# Patient Record
Sex: Female | Born: 1950
Health system: Southern US, Community
[De-identification: ages and names within clinical notes are randomized; demographics above are authoritative.]

## PROBLEM LIST (undated history)

## (undated) DIAGNOSIS — C4490 Unspecified malignant neoplasm of skin, unspecified: Secondary | ICD-10-CM

## (undated) DIAGNOSIS — M1990 Unspecified osteoarthritis, unspecified site: Secondary | ICD-10-CM

## (undated) DIAGNOSIS — I82409 Acute embolism and thrombosis of unspecified deep veins of unspecified lower extremity: Secondary | ICD-10-CM

## (undated) HISTORY — PX: SKIN BIOPSY: SHX1

## (undated) HISTORY — PX: CHOLECYSTECTOMY: SHX55

## (undated) HISTORY — PX: BACK SURGERY: SHX140

## (undated) HISTORY — PX: ABDOMINAL HYSTERECTOMY: SHX81

---

## 2000-11-25 ENCOUNTER — Other Ambulatory Visit: Admission: RE | Admit: 2000-11-25 | Discharge: 2000-11-25 | Payer: Self-pay | Admitting: Gynecology

## 2001-07-11 ENCOUNTER — Other Ambulatory Visit: Admission: RE | Admit: 2001-07-11 | Discharge: 2001-07-11 | Payer: Self-pay | Admitting: Gynecology

## 2001-09-18 ENCOUNTER — Ambulatory Visit (HOSPITAL_COMMUNITY): Admission: RE | Admit: 2001-09-18 | Discharge: 2001-09-18 | Payer: Self-pay | Admitting: Gastroenterology

## 2002-09-20 ENCOUNTER — Other Ambulatory Visit: Admission: RE | Admit: 2002-09-20 | Discharge: 2002-09-20 | Payer: Self-pay | Admitting: Gynecology

## 2003-10-30 ENCOUNTER — Other Ambulatory Visit: Admission: RE | Admit: 2003-10-30 | Discharge: 2003-10-30 | Payer: Self-pay | Admitting: Gynecology

## 2004-11-04 ENCOUNTER — Other Ambulatory Visit: Admission: RE | Admit: 2004-11-04 | Discharge: 2004-11-04 | Payer: Self-pay | Admitting: Gynecology

## 2005-01-29 ENCOUNTER — Ambulatory Visit (HOSPITAL_COMMUNITY): Admission: RE | Admit: 2005-01-29 | Discharge: 2005-01-30 | Payer: Self-pay | Admitting: Neurosurgery

## 2005-03-02 ENCOUNTER — Inpatient Hospital Stay (HOSPITAL_COMMUNITY): Admission: EM | Admit: 2005-03-02 | Discharge: 2005-03-06 | Payer: Self-pay | Admitting: Emergency Medicine

## 2005-08-19 ENCOUNTER — Encounter: Payer: Self-pay | Admitting: Vascular Surgery

## 2005-08-19 ENCOUNTER — Ambulatory Visit (HOSPITAL_COMMUNITY): Admission: RE | Admit: 2005-08-19 | Discharge: 2005-08-19 | Payer: Self-pay | Admitting: Family Medicine

## 2005-11-02 ENCOUNTER — Ambulatory Visit (HOSPITAL_COMMUNITY): Admission: RE | Admit: 2005-11-02 | Discharge: 2005-11-02 | Payer: Self-pay | Admitting: Family Medicine

## 2005-11-02 ENCOUNTER — Encounter: Payer: Self-pay | Admitting: Vascular Surgery

## 2006-05-11 IMAGING — CR DG CHEST 2V
2 series · 2 of 2 positions shown · non-contrast
Comparison: none

CLINICAL DATA: DVT right leg with back surgery a month ago.
 CHEST - 2 VIEW:

[view not recorded (1 of 2)]
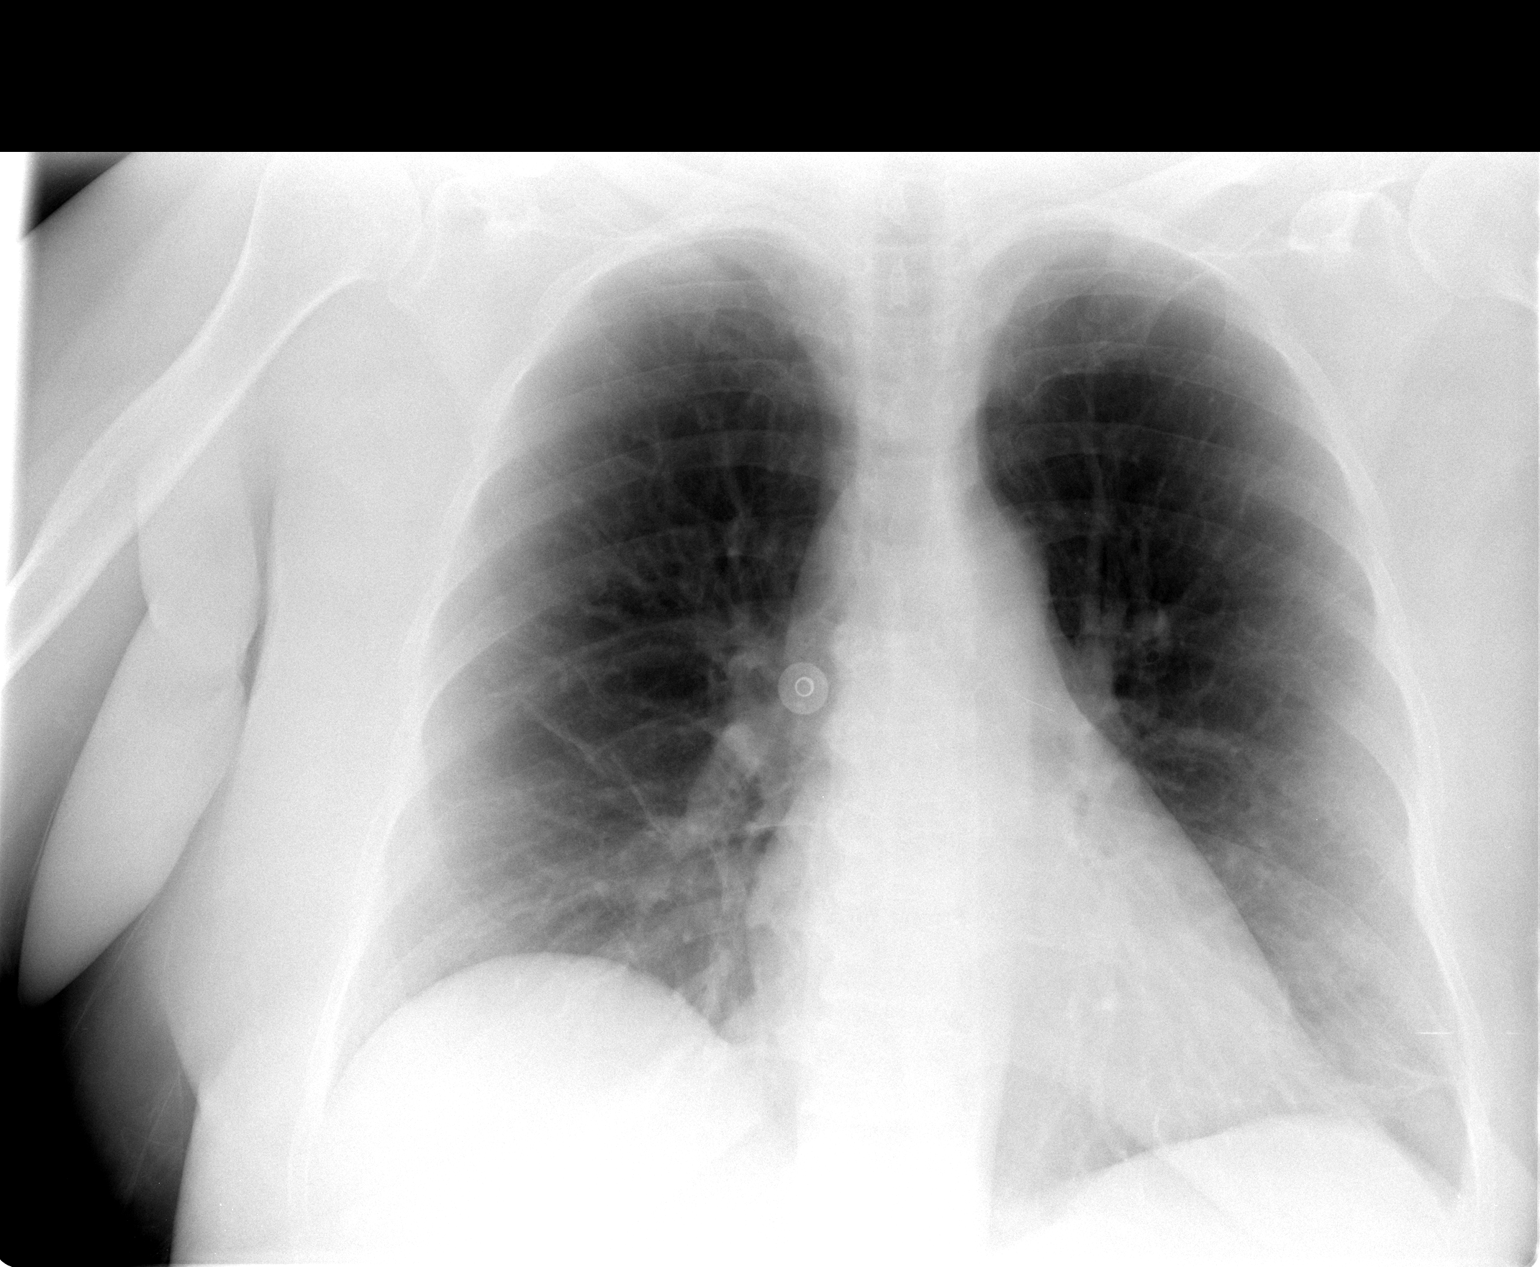

[view not recorded (2 of 2)]
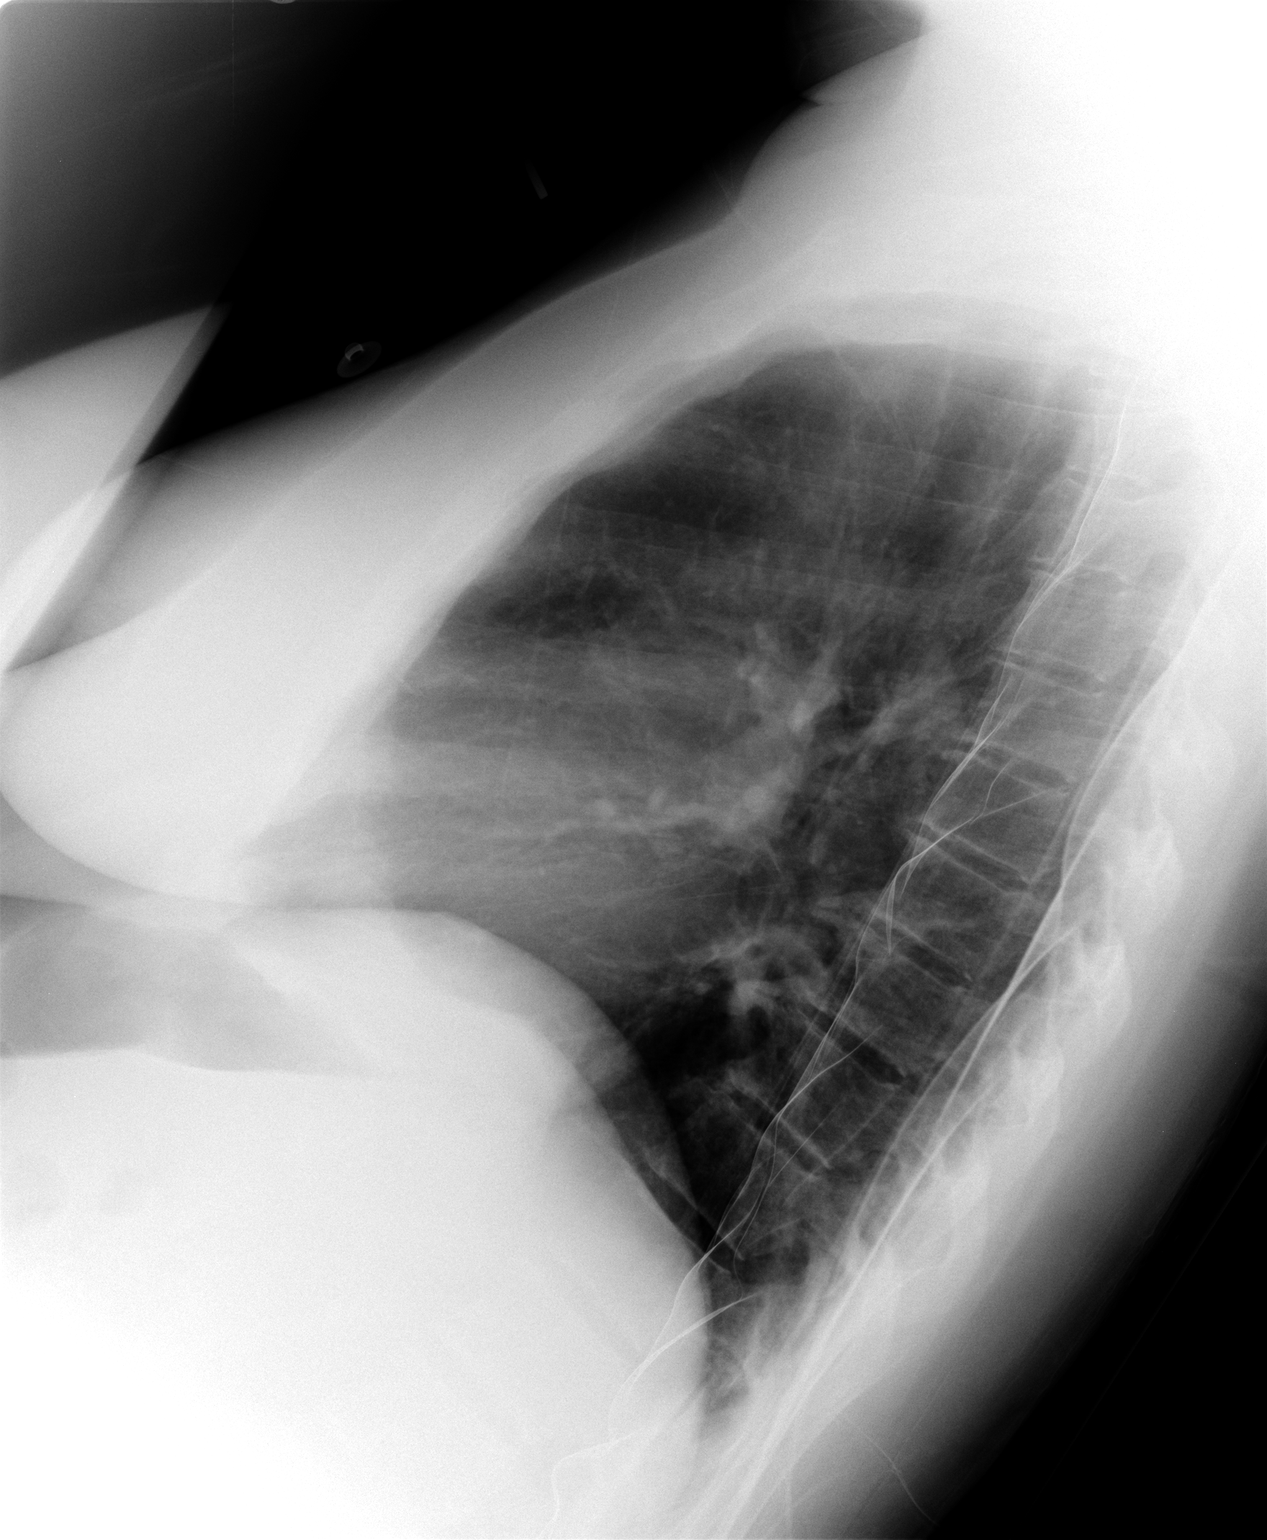

[2 of 2 positions shown; findings below may reference images not displayed]

FINDINGS: Two views of the chest show linear opacities at the left base and right mid lung base, most consistent with atelectasis or scarring.  No active process is seen.  The heart is mildly enlarged.  No bony abnormality is seen.
IMPRESSION: No active lung disease.  Linear scarring or atelectasis at the bases.

## 2008-01-04 ENCOUNTER — Emergency Department (HOSPITAL_COMMUNITY): Admission: EM | Admit: 2008-01-04 | Discharge: 2008-01-04 | Payer: Self-pay | Admitting: Emergency Medicine

## 2010-07-10 NOTE — Op Note (Signed)
   [name], [name]                     ACCOUNT NO.:  [id number]   MEDICAL RECORD NO.:  [id number]                   PATIENT TYPE:  AMB   LOCATION:  ENDO                                 FACILITY:  [facility]   PHYSICIAN:  Barrie Folk, M.D.                  DATE OF BIRTH:  [birth year]   DATE OF PROCEDURE:  09/18/2001  DATE OF DISCHARGE:                                 OPERATIVE REPORT   PROCEDURE:  Colonoscopy.   INDICATIONS FOR PROCEDURE:  Screening colonoscopy in a 59 year old patient  with no prior colon screening.   DESCRIPTION OF PROCEDURE:  The patient was placed in the left lateral  decubitus position and placed on the pulse monitor with continuous low-flow  oxygen delivered by nasal cannula.  She was sedated with 70 mg IV Demerol  and 8 mg IV Versed.  The Olympus video colonoscope was inserted into the  rectum and advanced to the cecum, confirmed by transillumination at  McBurney's point and visualization of the ileocecal valve and appendiceal  orifice.  The prep was excellent.  The cecum, ascending, transverse,  descending, and sigmoid colon all appeared normal with no masses, polyps,  diverticula, or other mucosal abnormalities.  The rectum likewise appeared  normal, and retroflexed view of the anus revealed no obvious internal  hemorrhoids.  The colonoscope was then withdrawn, and the patient returned  to the recovery room in stable condition.  She tolerated the procedure well,  and there were no immediate complications.   IMPRESSION:  Normal colonoscopy.   PLAN:  Flexible sigmoidoscopy in five years.                                               Barrie Folk, M.D.    JCH/MEDQ  D:  09/18/2001  T:  09/21/2001  Job:  [id number]   cc:   Vanita Panda

## 2010-07-10 NOTE — Discharge Summary (Signed)
[name], [name]               ACCOUNT NO.:  [id number]   MEDICAL RECORD NO.:  [id number]          PATIENT TYPE:  INP   LOCATION:  4735                         FACILITY:  [facility]   PHYSICIAN:  Jackie Plum, M.D.DATE OF BIRTH:  17-Aug-1950   DATE OF ADMISSION:  03/02/2005  DATE OF DISCHARGE:  03/06/2005                                 DISCHARGE SUMMARY   DISCHARGE DIAGNOSES:  1.  Right lower extremity deep vein thrombosis.  2.  Infected surgical wound on antibiotics.  3.  History of recent back surgery.  4.  History of irritable bowel syndrome.   DISCHARGE MEDICATIONS:  1.  Coumadin 1 mg, two tablets q.h.s. until any changes effected by her      primary care physician, Dr. Idell Pickles, based on outpatient pro time level.  2.  Ambien 5 mg p.o. q.h.s. for sleep.  3.  Ciprofloxacin 500 mg p.o. b.i.d.   CONSULTATIONS:  1.  Wound care team.  2.  Kathaleen Maser. Pool, M.D. of neurosurgery.   DISCHARGE CONDITION:  Improved and satisfactory.   The patient presented with right leg pain and swelling.  She had recently  had a right L5-S1 laminectomy and microdiskectomy by Dr. Jordan Likes on January 29, 2005.  Imaging studies revealed DVT involving the right lower extremity  which was quite extensive, all the way to the femoral vein.  She has had an  infected wound at the site of her surgery.  She was therefore admitted for  further management.  On admission, the patient was placed on telemetry  monitoring.  There were no significant dysarrthmias.  She had complaints of  chest pain which was at times worrisome for possible extension of her DVT to  pulmonary emboli.  We therefore obtained a CT angiogram which was negative  for any PE fortunately.  The patient was seen by the wound care team as well  as Dr. Jordan Likes who recommended p.o. antibiotics with Cipro.  The wound cultures  were positive for streptococcus which was sensitive to fluoroquinolones.  Cipro was continued with improvement in her wound.   The patient has been  therapeutic on her Coumadin, based on INR checks and she has not had any  other pulmonary complaints.  She is doing fine, and she is deemed ready for  discharge today with followup.   The patient has been asked to call her PCP's office on Monday for an  appointment to see her PCP in 7-10 days, earlier if there are any problems.  I have also ask her to go to the PCP's office on Monday for a pro time check  at which time the dosage of her Coumadin may be adjusted based on the pro  time level by her PCP.  The patient is  discharged in stable and satisfactory condition.  Her cardiopulmonary exam  was unremarkable today.  Her vital signs remained stable.  And, we spent  some time to discuss all the workup done for her that was significant for  this workup and this is regarding her anticoagulation.  She is discharged in  stable and satisfactory condition.  Jackie Plum, M.D.  Electronically Signed     GO/MEDQ  D:  03/06/2005  T:  03/06/2005  Job:  [id number]   cc:   Dellis Anes. Idell Pickles, M.D.  Fax: [phone]   Kathaleen Maser. Pool, M.D.  Fax: [phone]

## 2010-07-10 NOTE — Op Note (Signed)
[name], [name]               ACCOUNT NO.:  [id number]   MEDICAL RECORD NO.:  [id number]          PATIENT TYPE:  AMB   LOCATION:  SDS                          FACILITY:  [facility]   PHYSICIAN:  Henry A. Pool, M.D.    DATE OF BIRTH:  1950-07-10   DATE OF PROCEDURE:  01/29/2005  DATE OF DISCHARGE:                                 OPERATIVE REPORT   PREOPERATIVE DIAGNOSIS:  Right L5-S1 herniated nucleus pulposus with  radiculopathy.   POSTOPERATIVE DIAGNOSIS:  Right L5-S1 herniated nucleus pulposus with  radiculopathy.   PROCEDURE NAME:  Right L5-S1 laminotomy and microdiskectomy.   SURGEON:  Kathaleen Maser. Pool, M.D.   ASSISTANT:  Tia Alert, M.D.   ANESTHESIA:  General orotracheal.   INDICATIONS:  Ms. Pelot is a 60 year old female with history of severe  back and right lower extremity pain consistent with right-sided S1  radiculopathy.  She has failed conservative management and workup  demonstrates evidence of a rightward L5-S1 disk herniation with marked  compression of the right-sided S1 nerve root.  The patient has failed  conservative management and decided to proceed with a right-sided L5-S1  laminotomy and microdiskectomy in hopes of improving her symptoms.   OPERATIVE NOTE:  The patient was taken to the operating room and placed on  the operating table in a supine position.  After adequate levels of  anesthesia achieved, the patient turned prone onto a Wilson frame and  appropriately padded.  The patient's lumbar region was prepped and draped  sterilely.  A 10 blade was used to make a linear skin incision overlying the  L5-S1 interspace.  This was carried down sharply in the midline.  Subperiosteal dissection was performed on the right side, exposing the  laminae and facet joints at L5 and S1.  A deep self-retaining retractor was  placed.  Intraoperative x-ray was taken and the level was confirmed.  A  laminotomy was then performed using a high-speed drill and  Kerrison rongeurs  to remove the inferior aspect of the lamina of L5, the medial aspect of the  L5-S1 facet joint and the superior rim of the S1 lamina.  The ligamentum  flavum was elevated and resected in a piecemeal fashion using Kerrison  rongeurs.  The underlying thecal sac and exiting S1 nerve root were  identified.  The microscope was brought into the field for microdissection  of the right-sided nerve root underlying disk herniation.  The epidural  venous plexus was coagulated and cut.  The thecal sac and S1 nerve root were  gradually mobilized and retracted toward the midline.  The disk herniation  was readily apparent.  A number of free fragments were encountered and  completely resected.  The disk space was incised with a 15 blade in a  rectangular fashion.  A wide disk space clean-out was then achieved using  pituitary rongeurs, upward-angled pituitary rongeurs and Epstein curettes.  All elements of the disk herniation were completely resected.  All loose or  obviously degenerative disk material was removed from the interspace.  At  this point a very thorough decompression had  been achieved.  There was no  evidence of injury to the thecal sac or nerve roots.  The wound was then  irrigated with antibiotic solution.  Gelfoam was placed topically for  hemostasis, found to be good.  The  microscope and retractor system were removed.  Hemostasis in the muscle was  achieved with electrocautery and the wounds were closed in layers with  Vicryl suture.  Steri-Strips and a sterile dressing were applied.  There  were no complications.  The patient tolerated the procedure well and she  returns to the recovery room postop.           ______________________________  Kathaleen Maser. Pool, M.D.     HAP/MEDQ  D:  01/29/2005  T:  01/30/2005  Job:  [id number]

## 2010-07-10 NOTE — H&P (Signed)
[name], [name]               ACCOUNT NO.:  [id number]   MEDICAL RECORD NO.:  [id number]          PATIENT TYPE:  INP   LOCATION:  4735                         FACILITY:  [facility]   PHYSICIAN:  Theone Stanley, MD   DATE OF BIRTH:  January 25, 1951   DATE OF ADMISSION:  03/02/2005  DATE OF DISCHARGE:                                HISTORY & PHYSICAL   CHIEF COMPLAINT:  Right leg swelling.   HISTORY OF PRESENT ILLNESS:  [name] is a very pleasant 60 year old  Caucasian female who recently had back surgery, a right L5-S1 laminectomy  and microdiskectomy by Dr. Jordan Likes on January 29, 2005.  The patient has been  doing reasonably well, only recent issue for the past week is she has had  increased drainage from her wound site.  On Saturday, she started to have  increasing swelling and a feeling of tightness in her right leg.  Her and  her husband elevated the leg, placed ice, however, it did not resolve.  Because of this increasing tightness and pain, she presented to the ER.  In  the ER an ultrasound was performed which showed a DVT which was extensive  all the way to the femoral vein.   PAST MEDICAL HISTORY:  1.  Back pain with recent surgery.  2.  IBS.   MEDICATIONS:  Currently none.   ALLERGIES:  1.  The patient is quite sensitive to all OPIATES.  She gets severe      constipation, nausea and vomiting.  2.  She is allergic to PENICILLIN.  She does not know the reaction.  She had      some type of reaction as a child.  3.  TRAMADOL.  She gets a rash.   FAMILY HISTORY:  Father had pulmonary fibrosis.  Mother had temporal  arteritis.  She had an aunt with brain cancer and also have a history of  diabetes and peptic ulcer disease in the family.   SOCIAL HISTORY:  The patient lives in [place] [facility], is married, and has two  grown children.  No tobacco, alcohol, or illicit drug use.   SURGERY:  1.  She had the recent L5-S1 laminectomy and microdiskectomy.  2.  Hysterectomy.  3.   Bladder tack.  4.  Rectal tack.   REVIEW OF SYSTEMS:  Mainly constipation.  All other systems were negative.   PHYSICAL EXAMINATION:  VITAL SIGNS:  Temperature of 97.1, blood pressure of  118/84, respiratory rate of 16, pulse of 119, sating 96% on room air.  HEENT:  Head atraumatic, normocephalic.  Eyes 2-mm, pupils reactive to  light.  Extraocular movements intact.  Ears and nose  no discharge.  Throat  clear.  No erythema.  No exudate.  Mucosa moist.  NECK:  Supple.  No lymphadenopathy.  No JVD.  HEART:  Regular rate and rhythm.  No murmurs, rubs, or gallops heard.  LUNGS:  Clear to auscultation bilaterally.  ABDOMEN:  Soft, nontender, nondistended.  EXTREMITIES:  The patient had a quite evident swelling on the right compared  to the left, some mild edema on the  right.  No cyanosis or clubbing.  NEUROLOGIC:  The patient was alert and oriented x3.  Nonfocal.  GU:  Deferred.   LABORATORY/RADIOLOGY:  White count of 11, hemoglobin 12, hematocrit 36,  platelets at 139.  PT of 14, INR of 1.1, PTT of 44.  Sodium 138, potassium  3.5, chloride at 104, CO2 of 23, glucose at 105, BUN at 2, creatinine of  0.6, calcium at 9.2.  Total protein at 6.2.  Albumin at 2.9.  AST at 16, ALT  at 19, alk phos of 20.  Total bili at 0.7.  Chest x-ray pending.  EKG  pending.  Doppler ultrasound of the right leg shows extensive clot in the  deep veins all the way up to the femoral vein.   ASSESSMENT/PLAN:  1.  Right sided deep vein thrombosis.  The patient was placed on heparin.      We will continue this, start Coumadin, and continue to observe the      patient on telemetry.  2.  Wound drainage.  I was able to contact Dr. Lindalou Hose partner, Dr. Wynetta Emery and      discussed the situation with him.  An MRI of the lumbar spine with and      without contrast will be obtained.  Blood cultures and wound culture      will also be obtained.  ESR and CRP levels will also be obtained.  Once      these are done, we will  start Unasyn empirically.  One of Dr. Lindalou Hose      partners or Dr. Jordan Likes will come by tomorrow to assess the patient at that      point.  3.  Prophylaxis.  The patient is on heparin.  We will start a proton pump      inhibitor.      Theone Stanley, MD  Electronically Signed     AEJ/MEDQ  D:  03/02/2005  T:  03/02/2005  Job:  [id number]   cc:   Dellis Anes. Idell Pickles, M.D.  Fax: [phone]   Kathaleen Maser. Pool, M.D.  Fax: [phone]

## 2011-06-12 ENCOUNTER — Emergency Department (HOSPITAL_COMMUNITY)
Admission: EM | Admit: 2011-06-12 | Discharge: 2011-06-12 | Disposition: A | Discharge status: Home / Self Care | Payer: BC Managed Care – PPO | Attending: Emergency Medicine | Admitting: Emergency Medicine

## 2011-06-12 ENCOUNTER — Encounter (HOSPITAL_COMMUNITY): Payer: Self-pay | Admitting: *Deleted

## 2011-06-12 DIAGNOSIS — M79609 Pain in unspecified limb: Secondary | ICD-10-CM | POA: Insufficient documentation

## 2011-06-12 DIAGNOSIS — Z85820 Personal history of malignant melanoma of skin: Secondary | ICD-10-CM | POA: Insufficient documentation

## 2011-06-12 DIAGNOSIS — M549 Dorsalgia, unspecified: Secondary | ICD-10-CM

## 2011-06-12 DIAGNOSIS — Z86718 Personal history of other venous thrombosis and embolism: Secondary | ICD-10-CM | POA: Insufficient documentation

## 2011-06-12 DIAGNOSIS — M545 Low back pain, unspecified: Secondary | ICD-10-CM | POA: Insufficient documentation

## 2011-06-12 DIAGNOSIS — M129 Arthropathy, unspecified: Secondary | ICD-10-CM | POA: Insufficient documentation

## 2011-06-12 DIAGNOSIS — Z79899 Other long term (current) drug therapy: Secondary | ICD-10-CM | POA: Insufficient documentation

## 2011-06-12 HISTORY — DX: Acute embolism and thrombosis of unspecified deep veins of unspecified lower extremity: I82.409

## 2011-06-12 HISTORY — DX: Unspecified malignant neoplasm of skin, unspecified: C44.90

## 2011-06-12 HISTORY — DX: Unspecified osteoarthritis, unspecified site: M19.90

## 2011-06-12 MED ORDER — METHYLPREDNISOLONE 4 MG PO KIT: PACK | ORAL | Status: AC
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
## 2011-06-12 NOTE — Discharge Instructions (Signed)
Back Pain, Adult Low back pain is very common. About 1 in 5 people have back pain.The cause of low back pain is rarely dangerous. The pain often gets better over time.About half of people with a sudden onset of back pain feel better in just 2 weeks. About 8 in 10 people feel better by 6 weeks.  CAUSES Some common causes of back pain include:  Strain of the muscles or ligaments supporting the spine.   Wear and tear (degeneration) of the spinal discs.   Arthritis.   Direct injury to the back.  DIAGNOSIS Most of the time, the direct cause of low back pain is not known.However, back pain can be treated effectively even when the exact cause of the pain is unknown.Answering your caregiver's questions about your overall health and symptoms is one of the most accurate ways to make sure the cause of your pain is not dangerous. If your caregiver needs more information, he or she may order lab work or imaging tests (X-rays or MRIs).However, even if imaging tests show changes in your back, this usually does not require surgery. HOME CARE INSTRUCTIONS For many people, back pain returns.Since low back pain is rarely dangerous, it is often a condition that people can learn to manageon their own.   Remain active. It is stressful on the back to sit or stand in one place. Do not sit, drive, or stand in one place for more than 30 minutes at a time. Take short walks on level surfaces as soon as pain allows.Try to increase the length of time you walk each day.   Do not stay in bed.Resting more than 1 or 2 days can delay your recovery.   Do not avoid exercise or work.Your body is made to move.It is not dangerous to be active, even though your back may hurt.Your back will likely heal faster if you return to being active before your pain is gone.   Pay attention to your body when you bend and lift. Many people have less discomfortwhen lifting if they bend their knees, keep the load close to their  bodies,and avoid twisting. Often, the most comfortable positions are those that put less stress on your recovering back.   Find a comfortable position to sleep. Use a firm mattress and lie on your side with your knees slightly bent. If you lie on your back, put a pillow under your knees.   Only take over-the-counter or prescription medicines as directed by your caregiver. Over-the-counter medicines to reduce pain and inflammation are often the most helpful.Your caregiver may prescribe muscle relaxant drugs.These medicines help dull your pain so you can more quickly return to your normal activities and healthy exercise.   Put ice on the injured area.   Put ice in a plastic bag.   Place a towel between your skin and the bag.   Leave the ice on for 15 to 20 minutes, 3 to 4 times a day for the first 2 to 3 days. After that, ice and heat may be alternated to reduce pain and spasms.   Ask your caregiver about trying back exercises and gentle massage. This may be of some benefit.   Avoid feeling anxious or stressed.Stress increases muscle tension and can worsen back pain.It is important to recognize when you are anxious or stressed and learn ways to manage it.Exercise is a great option.  SEEK MEDICAL CARE IF:  You have pain that is not relieved with rest or medicine.   You have   pain that does not improve in 1 week.   You have new symptoms.   You are generally not feeling well.  SEEK IMMEDIATE MEDICAL CARE IF:   You have pain that radiates from your back into your legs.   You develop new bowel or bladder control problems.   You have unusual weakness or numbness in your arms or legs.   You develop nausea or vomiting.   You develop abdominal pain.   You feel faint.  Document Released: 02/08/2005 Document Revised: 01/28/2011 Document Reviewed: 06/29/2010 [employer] Patient Information 2012 [employer]. 

## 2012-01-06 ENCOUNTER — Emergency Department (HOSPITAL_COMMUNITY)
Admission: EM | Admit: 2012-01-06 | Discharge: 2012-01-07 | Disposition: A | Discharge status: Home / Self Care | Payer: BC Managed Care – PPO | Attending: Emergency Medicine | Admitting: Emergency Medicine

## 2012-01-06 ENCOUNTER — Encounter (HOSPITAL_COMMUNITY): Payer: Self-pay | Admitting: Emergency Medicine

## 2012-01-06 DIAGNOSIS — Z79899 Other long term (current) drug therapy: Secondary | ICD-10-CM | POA: Insufficient documentation

## 2012-01-06 DIAGNOSIS — Z8739 Personal history of other diseases of the musculoskeletal system and connective tissue: Secondary | ICD-10-CM | POA: Insufficient documentation

## 2012-01-06 DIAGNOSIS — Z85828 Personal history of other malignant neoplasm of skin: Secondary | ICD-10-CM | POA: Insufficient documentation

## 2012-01-06 DIAGNOSIS — Z86718 Personal history of other venous thrombosis and embolism: Secondary | ICD-10-CM | POA: Insufficient documentation

## 2012-01-06 DIAGNOSIS — R319 Hematuria, unspecified: Secondary | ICD-10-CM

## 2012-01-06 DIAGNOSIS — N390 Urinary tract infection, site not specified: Secondary | ICD-10-CM | POA: Insufficient documentation

## 2012-01-06 LAB — URINALYSIS, ROUTINE W REFLEX MICROSCOPIC
Bilirubin Urine: NEGATIVE
Nitrite: NEGATIVE
Specific Gravity, Urine: 1.011 (ref 1.005–1.030)
pH: 5.5 (ref 5.0–8.0)

## 2012-01-06 LAB — URINE MICROSCOPIC-ADD ON

## 2012-01-06 NOTE — ED Notes (Signed)
Per EMS pt states she has blood in her urine and is c/o lower abd pain  Pt states sxs started a couple days ago  Pt states she also has a burning sensation with urination  Pain started around 9pm tonight

## 2012-01-06 NOTE — ED Notes (Signed)
WUJ:[id number]<JY> Expected date:01/06/12<BR> Expected time:10:17 PM<BR> Means of arrival:Ambulance<BR> Comments:<BR> abd pain

## 2012-01-07 ENCOUNTER — Emergency Department (HOSPITAL_COMMUNITY): Payer: BC Managed Care – PPO

## 2012-01-07 MED ORDER — CIPROFLOXACIN HCL 500 MG PO TABS: 500.0000 mg | ORAL_TABLET | Freq: Two times a day (BID) | ORAL | Status: DC

## 2012-01-07 MED ORDER — CIPROFLOXACIN HCL 500 MG PO TABS
500.0000 mg | ORAL_TABLET | Freq: Once | ORAL | Status: AC
  Administered 2012-01-07: 500 mg via ORAL
  Filled 2012-01-07: qty 1

## 2012-01-07 NOTE — ED Provider Notes (Signed)
History     CSN: [id number]  Arrival date & time 01/06/12  2229   First MD Initiated Contact with Patient 01/06/12 2318      Chief Complaint  Patient presents with  . Hematuria     HPI Pt was seen at 2320.  Per pt, c/o gradual onset and persistence of constant hematuria, dysuria, and suprapubic discomfort which began several days ago, worse today.  Pt has hx of chronic LBP and is unsure if it is any different from her usual chronic pain.  Denies vaginal bleeding/discharge, no N/V/D, no fevers, no flank pain, no injury.    Past Medical History  Diagnosis Date  . Arthritis     HX of chronic pain from Arthritis  . DVT, lower extremity   . Cancer of skin     scalp    Past Surgical History  Procedure Date  . Back surgery     disk surgery  . Cholecystectomy   . Abdominal hysterectomy   . Skin biopsy     Family History  Problem Relation Age of Onset  . COPD Mother   . Pulmonary fibrosis Father     History  Substance Use Topics  . Smoking status: Never Smoker   . Smokeless tobacco: Not on file  . Alcohol Use: No    Review of Systems ROS: Statement: All systems negative except as marked or noted in the HPI; Constitutional: Negative for fever and chills. ; ; Eyes: Negative for eye pain, redness and discharge. ; ; ENMT: Negative for ear pain, hoarseness, nasal congestion, sinus pressure and sore throat. ; ; Cardiovascular: Negative for chest pain, palpitations, diaphoresis, dyspnea and peripheral edema. ; ; Respiratory: Negative for cough, wheezing and stridor. ; ; Gastrointestinal: Negative for nausea, vomiting, diarrhea, abdominal pain, blood in stool, hematemesis, jaundice and rectal bleeding. . ; ; Genitourinary: +dysuria, hematuria. Negative for flank pain. ; ; GYN:  No vaginal bleeding, no vaginal discharge, no vulvar pain. ;; Musculoskeletal: Negative for back pain and neck pain. Negative for swelling and trauma.; ; Skin: Negative for pruritus, rash, abrasions,  blisters, bruising and skin lesion.; ; Neuro: Negative for headache, lightheadedness and neck stiffness. Negative for weakness, altered level of consciousness , altered mental status, extremity weakness, paresthesias, involuntary movement, seizure and syncope.       Allergies  Penicillins  Home Medications   Current Outpatient Rx  Name  Route  Sig  Dispense  Refill  . SUPER B COMPLEX PO   Oral   Take 1 tablet by mouth daily.         Marland Kitchen BIOTIN 5 MG PO CAPS   Oral   Take 1 capsule by mouth daily.         Marland Kitchen CALCIUM CARBONATE-VITAMIN D 600-200 MG-UNIT PO TABS   Oral   Take 1 tablet by mouth daily.         Marland Kitchen VITAMIN D3 1000 UNITS PO TABS   Oral   Take 1,000 Units by mouth 2 (two) times daily.         . COQ10 PO   Oral   Take 1 tablet by mouth daily.         Marland Kitchen CRANBERRY PO   Oral   Take 1 tablet by mouth 2 (two) times daily.         . ADULT MULTIVITAMIN W/MINERALS CH   Oral   Take 1 tablet by mouth daily.         Marland Kitchen NAPROXEN SODIUM 220  MG PO TABS   Oral   Take 440 mg by mouth 2 (two) times daily.         Marland Kitchen OVER THE COUNTER MEDICATION   Oral   Take 1 tablet by mouth daily. Schiff Mega Red joint supplement         . VITAMIN B-6 100 MG PO TABS   Oral   Take 200 mg by mouth 2 (two) times daily.         Marland Kitchen VITAMIN C 500 MG PO TABS   Oral   Take 500 mg by mouth daily.         Marland Kitchen VITAMIN E 400 UNITS PO CAPS   Oral   Take 400 Units by mouth daily.           BP 124/97  Pulse 84  Temp 98.1 F (36.7 C) (Oral)  Resp 20  SpO2 100%  Physical Exam 2325: Physical examination:  Nursing notes reviewed; Vital signs and O2 SAT reviewed;  Constitutional: Well developed, Well nourished, Well hydrated, In no acute distress; Head:  Normocephalic, atraumatic; Eyes: EOMI, PERRL, No scleral icterus; ENMT: Mouth and pharynx normal, Mucous membranes moist; Neck: Supple, Full range of motion, No lymphadenopathy; Cardiovascular: Regular rate and rhythm, No  murmur, rub, or gallop; Respiratory: Breath sounds clear & equal bilaterally, No rales, rhonchi, wheezes.  Speaking full sentences with ease, Normal respiratory effort/excursion; Chest: Nontender, Movement normal; Abdomen: Soft, Nontender, Nondistended, Normal bowel sounds; Genitourinary: No CVA tenderness; Extremities: Pulses normal, No tenderness, No edema, No calf edema or asymmetry.; Neuro: AA&Ox3, Major CN grossly intact.  Speech clear. No gross focal motor or sensory deficits in extremities. Gait steady.; Skin: Color normal, Warm, Dry.   ED Course  Procedures    MDM  MDM Reviewed: nursing note and vitals Interpretation: labs and CT scan   Results for orders placed during the hospital encounter of 01/06/12  URINALYSIS, ROUTINE W REFLEX MICROSCOPIC      Component Value Range   Color, Urine YELLOW  YELLOW   APPearance CLOUDY (*) CLEAR   Specific Gravity, Urine 1.011  1.005 - 1.030   pH 5.5  5.0 - 8.0   Glucose, UA NEGATIVE  NEGATIVE mg/dL   Hgb urine dipstick LARGE (*) NEGATIVE   Bilirubin Urine NEGATIVE  NEGATIVE   Ketones, ur NEGATIVE  NEGATIVE mg/dL   Protein, ur 409 (*) NEGATIVE mg/dL   Urobilinogen, UA 0.2  0.0 - 1.0 mg/dL   Nitrite NEGATIVE  NEGATIVE   Leukocytes, UA LARGE (*) NEGATIVE  URINE MICROSCOPIC-ADD ON      Component Value Range   WBC, UA TOO NUMEROUS TO COUNT  <3 WBC/hpf   RBC / HPF TOO NUMEROUS TO COUNT  <3 RBC/hpf   Bacteria, UA RARE  RARE   Urine-Other MUCOUS PRESENT     Ct Abdomen Pelvis Wo Contrast 01/07/2012  *RADIOLOGY REPORT*  Clinical Data: Hematuria  CT ABDOMEN AND PELVIS WITHOUT CONTRAST  Technique:  Multidetector CT imaging of the abdomen and pelvis was performed following the standard protocol without intravenous contrast.  Comparison: None  Findings: Lung bases:  Lung bases are clear.  No pericardial or pleural effusion.  Heart size is normal.  Abdomen/pelvis:  No focal liver abnormality.  Prior cholecystectomy.  No significant biliary dilatation.   The pancreas appears within normal limits.  Normal appearance of the spleen.  Normal appearing bilateral adrenal glands.  The right kidney is normal.  The left kidney is normal. Mild cystocele.  The urinary bladder  is otherwise within the normal limits.  Previous hysterectomy.  No enlarged upper abdominal lymph nodes.  There is no pelvic or inguinal adenopathy. There is no free fluid or fluid collections identified within the abdomen or pelvis.  The stomach appears normal.  The small bowel loops have a normal caliber without evidence for obstruction.  Normal appearance of the colon.  The appendix is not visualize separate from the right lower quadrant bowel loops.  No secondary signs of acute appendicitis.  Bones/Musculoskeletal:  No acute bony abnormalities identified.  No aggressive lytic or sclerotic bone lesions noted.  IMPRESSION:  1.  No acute findings identified.   Original Report Authenticated By: Signa Kell, M.D.       [phone]:   +UTI, UC pending.  No CT evidence of acute process, or urinary calculi.  Pt wants to go home now. Dx and testing d/w pt.  Questions answered.  Verb understanding, agreeable to d/c home with outpt f/u.        Laray Anger, DO 01/09/12 [phone]

## 2012-01-07 NOTE — ED Notes (Signed)
Pt observed ambulating in hallway to restroom with no problem.

## 2012-01-10 LAB — URINE CULTURE

## 2012-12-28 ENCOUNTER — Other Ambulatory Visit: Payer: Self-pay

## 2013-10-25 ENCOUNTER — Other Ambulatory Visit: Payer: Self-pay | Admitting: Family Medicine

## 2013-10-25 ENCOUNTER — Ambulatory Visit
Admission: RE | Admit: 2013-10-25 | Discharge: 2013-10-25 | Disposition: A | Discharge status: Home / Self Care | Payer: BC Managed Care – PPO | Source: Ambulatory Visit | Attending: Family Medicine | Admitting: Family Medicine

## 2013-10-25 DIAGNOSIS — J329 Chronic sinusitis, unspecified: Secondary | ICD-10-CM

## 2013-11-05 ENCOUNTER — Other Ambulatory Visit: Payer: Self-pay | Admitting: Family Medicine

## 2013-11-05 DIAGNOSIS — R51 Headache: Secondary | ICD-10-CM

## 2013-11-21 ENCOUNTER — Other Ambulatory Visit: Payer: BC Managed Care – PPO

## 2013-11-22 ENCOUNTER — Ambulatory Visit
Admission: RE | Admit: 2013-11-22 | Discharge: 2013-11-22 | Disposition: A | Discharge status: Home / Self Care | Payer: BC Managed Care – PPO | Source: Ambulatory Visit | Attending: Family Medicine | Admitting: Family Medicine

## 2013-11-22 DIAGNOSIS — R51 Headache: Secondary | ICD-10-CM

## 2013-11-22 MED ORDER — IOHEXOL 300 MG/ML  SOLN
75.0000 mL | Freq: Once | INTRAMUSCULAR | Status: AC | PRN
  Administered 2013-11-22: 75 mL via INTRAVENOUS

## 2014-08-19 ENCOUNTER — Other Ambulatory Visit: Payer: Self-pay

## 2015-05-21 DIAGNOSIS — H6123 Impacted cerumen, bilateral: Secondary | ICD-10-CM | POA: Diagnosis not present

## 2015-05-21 DIAGNOSIS — S80911A Unspecified superficial injury of right knee, initial encounter: Secondary | ICD-10-CM | POA: Diagnosis not present

## 2015-05-21 DIAGNOSIS — M79659 Pain in unspecified thigh: Secondary | ICD-10-CM | POA: Diagnosis not present

## 2015-06-09 DIAGNOSIS — E860 Dehydration: Secondary | ICD-10-CM | POA: Diagnosis not present

## 2015-06-09 DIAGNOSIS — E871 Hypo-osmolality and hyponatremia: Secondary | ICD-10-CM | POA: Diagnosis not present

## 2015-06-09 DIAGNOSIS — Z136 Encounter for screening for cardiovascular disorders: Secondary | ICD-10-CM | POA: Diagnosis not present

## 2015-06-11 DIAGNOSIS — M19011 Primary osteoarthritis, right shoulder: Secondary | ICD-10-CM | POA: Diagnosis not present

## 2015-06-11 DIAGNOSIS — E871 Hypo-osmolality and hyponatremia: Secondary | ICD-10-CM | POA: Diagnosis not present

## 2015-06-11 DIAGNOSIS — M19012 Primary osteoarthritis, left shoulder: Secondary | ICD-10-CM | POA: Diagnosis not present

## 2015-06-11 DIAGNOSIS — M7542 Impingement syndrome of left shoulder: Secondary | ICD-10-CM | POA: Diagnosis not present

## 2015-06-11 DIAGNOSIS — M7541 Impingement syndrome of right shoulder: Secondary | ICD-10-CM | POA: Diagnosis not present

## 2015-07-23 DIAGNOSIS — J309 Allergic rhinitis, unspecified: Secondary | ICD-10-CM | POA: Diagnosis not present

## 2015-07-23 DIAGNOSIS — M1711 Unilateral primary osteoarthritis, right knee: Secondary | ICD-10-CM | POA: Diagnosis not present

## 2015-07-23 DIAGNOSIS — M7541 Impingement syndrome of right shoulder: Secondary | ICD-10-CM | POA: Diagnosis not present

## 2015-08-30 DIAGNOSIS — Z1231 Encounter for screening mammogram for malignant neoplasm of breast: Secondary | ICD-10-CM | POA: Diagnosis not present

## 2015-09-24 DIAGNOSIS — M549 Dorsalgia, unspecified: Secondary | ICD-10-CM | POA: Diagnosis not present

## 2015-09-24 DIAGNOSIS — M19012 Primary osteoarthritis, left shoulder: Secondary | ICD-10-CM | POA: Diagnosis not present

## 2015-09-24 DIAGNOSIS — M1711 Unilateral primary osteoarthritis, right knee: Secondary | ICD-10-CM | POA: Diagnosis not present

## 2015-09-24 DIAGNOSIS — M162 Bilateral osteoarthritis resulting from hip dysplasia: Secondary | ICD-10-CM | POA: Diagnosis not present

## 2015-10-13 DIAGNOSIS — M549 Dorsalgia, unspecified: Secondary | ICD-10-CM | POA: Diagnosis not present

## 2015-10-13 DIAGNOSIS — M160 Bilateral primary osteoarthritis of hip: Secondary | ICD-10-CM | POA: Diagnosis not present

## 2015-10-13 DIAGNOSIS — M170 Bilateral primary osteoarthritis of knee: Secondary | ICD-10-CM | POA: Diagnosis not present

## 2015-10-13 DIAGNOSIS — M8580 Other specified disorders of bone density and structure, unspecified site: Secondary | ICD-10-CM | POA: Diagnosis not present

## 2015-10-14 ENCOUNTER — Other Ambulatory Visit: Payer: Self-pay | Admitting: Family Medicine

## 2015-10-14 ENCOUNTER — Ambulatory Visit
Admission: RE | Admit: 2015-10-14 | Discharge: 2015-10-14 | Disposition: A | Discharge status: Home / Self Care | Payer: Self-pay | Source: Ambulatory Visit | Attending: Family Medicine | Admitting: Family Medicine

## 2015-10-14 DIAGNOSIS — M179 Osteoarthritis of knee, unspecified: Secondary | ICD-10-CM | POA: Diagnosis not present

## 2015-10-14 DIAGNOSIS — M170 Bilateral primary osteoarthritis of knee: Secondary | ICD-10-CM

## 2015-10-14 DIAGNOSIS — M25562 Pain in left knee: Secondary | ICD-10-CM | POA: Diagnosis not present

## 2015-10-21 DIAGNOSIS — L814 Other melanin hyperpigmentation: Secondary | ICD-10-CM | POA: Diagnosis not present

## 2015-10-21 DIAGNOSIS — D235 Other benign neoplasm of skin of trunk: Secondary | ICD-10-CM | POA: Diagnosis not present

## 2015-10-21 DIAGNOSIS — Z85828 Personal history of other malignant neoplasm of skin: Secondary | ICD-10-CM | POA: Diagnosis not present

## 2015-10-21 DIAGNOSIS — L821 Other seborrheic keratosis: Secondary | ICD-10-CM | POA: Diagnosis not present

## 2015-10-21 DIAGNOSIS — L700 Acne vulgaris: Secondary | ICD-10-CM | POA: Diagnosis not present

## 2015-10-21 DIAGNOSIS — D485 Neoplasm of uncertain behavior of skin: Secondary | ICD-10-CM | POA: Diagnosis not present

## 2015-10-21 DIAGNOSIS — D1801 Hemangioma of skin and subcutaneous tissue: Secondary | ICD-10-CM | POA: Diagnosis not present

## 2015-12-01 DIAGNOSIS — M1711 Unilateral primary osteoarthritis, right knee: Secondary | ICD-10-CM | POA: Diagnosis not present

## 2015-12-01 DIAGNOSIS — H6123 Impacted cerumen, bilateral: Secondary | ICD-10-CM | POA: Diagnosis not present

## 2015-12-01 DIAGNOSIS — M549 Dorsalgia, unspecified: Secondary | ICD-10-CM | POA: Diagnosis not present

## 2015-12-01 DIAGNOSIS — M19011 Primary osteoarthritis, right shoulder: Secondary | ICD-10-CM | POA: Diagnosis not present

## 2015-12-01 DIAGNOSIS — Z23 Encounter for immunization: Secondary | ICD-10-CM | POA: Diagnosis not present

## 2015-12-01 DIAGNOSIS — M19012 Primary osteoarthritis, left shoulder: Secondary | ICD-10-CM | POA: Diagnosis not present

## 2015-12-09 DIAGNOSIS — M1711 Unilateral primary osteoarthritis, right knee: Secondary | ICD-10-CM | POA: Diagnosis not present

## 2015-12-16 DIAGNOSIS — M1711 Unilateral primary osteoarthritis, right knee: Secondary | ICD-10-CM | POA: Diagnosis not present

## 2015-12-16 DIAGNOSIS — M170 Bilateral primary osteoarthritis of knee: Secondary | ICD-10-CM | POA: Diagnosis not present

## 2015-12-23 DIAGNOSIS — M1711 Unilateral primary osteoarthritis, right knee: Secondary | ICD-10-CM | POA: Diagnosis not present

## 2015-12-23 DIAGNOSIS — Z6826 Body mass index (BMI) 26.0-26.9, adult: Secondary | ICD-10-CM | POA: Diagnosis not present

## 2015-12-30 DIAGNOSIS — M1711 Unilateral primary osteoarthritis, right knee: Secondary | ICD-10-CM | POA: Diagnosis not present

## 2016-01-09 DIAGNOSIS — M19049 Primary osteoarthritis, unspecified hand: Secondary | ICD-10-CM | POA: Diagnosis not present

## 2016-01-09 DIAGNOSIS — M19011 Primary osteoarthritis, right shoulder: Secondary | ICD-10-CM | POA: Diagnosis not present

## 2016-01-09 DIAGNOSIS — M19012 Primary osteoarthritis, left shoulder: Secondary | ICD-10-CM | POA: Diagnosis not present

## 2016-01-09 DIAGNOSIS — M170 Bilateral primary osteoarthritis of knee: Secondary | ICD-10-CM | POA: Diagnosis not present

## 2016-01-13 DIAGNOSIS — H5203 Hypermetropia, bilateral: Secondary | ICD-10-CM | POA: Diagnosis not present

## 2016-01-13 DIAGNOSIS — H26033 Infantile and juvenile nuclear cataract, bilateral: Secondary | ICD-10-CM | POA: Diagnosis not present

## 2016-01-13 DIAGNOSIS — H52223 Regular astigmatism, bilateral: Secondary | ICD-10-CM | POA: Diagnosis not present

## 2016-01-20 DIAGNOSIS — M5136 Other intervertebral disc degeneration, lumbar region: Secondary | ICD-10-CM | POA: Diagnosis not present

## 2016-01-20 DIAGNOSIS — G603 Idiopathic progressive neuropathy: Secondary | ICD-10-CM | POA: Diagnosis not present

## 2016-01-20 DIAGNOSIS — M9906 Segmental and somatic dysfunction of lower extremity: Secondary | ICD-10-CM | POA: Diagnosis not present

## 2016-01-20 DIAGNOSIS — M9904 Segmental and somatic dysfunction of sacral region: Secondary | ICD-10-CM | POA: Diagnosis not present

## 2016-01-20 DIAGNOSIS — M25552 Pain in left hip: Secondary | ICD-10-CM | POA: Diagnosis not present

## 2016-01-20 DIAGNOSIS — M9905 Segmental and somatic dysfunction of pelvic region: Secondary | ICD-10-CM | POA: Diagnosis not present

## 2016-01-20 DIAGNOSIS — M9903 Segmental and somatic dysfunction of lumbar region: Secondary | ICD-10-CM | POA: Diagnosis not present

## 2016-01-20 DIAGNOSIS — M25561 Pain in right knee: Secondary | ICD-10-CM | POA: Diagnosis not present

## 2016-01-28 ENCOUNTER — Encounter (HOSPITAL_COMMUNITY): Payer: Self-pay

## 2016-01-28 ENCOUNTER — Emergency Department (HOSPITAL_COMMUNITY)
Admission: EM | Admit: 2016-01-28 | Discharge: 2016-01-28 | Disposition: A | Discharge status: Home / Self Care | Payer: PPO | Attending: Emergency Medicine | Admitting: Emergency Medicine

## 2016-01-28 ENCOUNTER — Emergency Department (HOSPITAL_COMMUNITY): Payer: PPO

## 2016-01-28 DIAGNOSIS — Z85828 Personal history of other malignant neoplasm of skin: Secondary | ICD-10-CM | POA: Diagnosis not present

## 2016-01-28 DIAGNOSIS — X509XXA Other and unspecified overexertion or strenuous movements or postures, initial encounter: Secondary | ICD-10-CM | POA: Diagnosis not present

## 2016-01-28 DIAGNOSIS — M545 Low back pain: Secondary | ICD-10-CM | POA: Diagnosis not present

## 2016-01-28 DIAGNOSIS — S3992XA Unspecified injury of lower back, initial encounter: Secondary | ICD-10-CM | POA: Diagnosis not present

## 2016-01-28 DIAGNOSIS — Z79899 Other long term (current) drug therapy: Secondary | ICD-10-CM | POA: Insufficient documentation

## 2016-01-28 DIAGNOSIS — Y929 Unspecified place or not applicable: Secondary | ICD-10-CM | POA: Diagnosis not present

## 2016-01-28 DIAGNOSIS — Y9301 Activity, walking, marching and hiking: Secondary | ICD-10-CM | POA: Insufficient documentation

## 2016-01-28 DIAGNOSIS — Y999 Unspecified external cause status: Secondary | ICD-10-CM | POA: Insufficient documentation

## 2016-01-28 DIAGNOSIS — S39012A Strain of muscle, fascia and tendon of lower back, initial encounter: Secondary | ICD-10-CM | POA: Diagnosis not present

## 2016-01-28 MED ORDER — DEXAMETHASONE SODIUM PHOSPHATE 10 MG/ML IJ SOLN
10.0000 mg | Freq: Once | INTRAMUSCULAR | Status: AC
  Administered 2016-01-28: 10 mg via INTRAMUSCULAR
  Filled 2016-01-28: qty 1

## 2016-01-28 MED ORDER — PREDNISONE 10 MG (21) PO TBPK: 10.0000 mg | ORAL_TABLET | Freq: Every day | ORAL | 0 refills | Status: DC

## 2016-01-28 MED ORDER — KETOROLAC TROMETHAMINE 30 MG/ML IJ SOLN
30.0000 mg | Freq: Once | INTRAMUSCULAR | Status: AC
  Administered 2016-01-28: 30 mg via INTRAMUSCULAR
  Filled 2016-01-28: qty 1

## 2016-01-28 NOTE — ED Triage Notes (Signed)
PT C/O RIGHT LOWER BACK PAIN SINCE Monday. PT STS SHE WAS WALKING DOWN THE HALL, AND SHE FELT A "POP" TO RIGHT LOWER BACK. SHE STS SHE KEPT WALKING, AND THE PAIN GOT WORSE. SHE STS SHE PUT ICE ON IT, AND THE NEXT MORNING IT WAS BETTER, BUT THE PAIN IS NOT COMPLETELY GONE. PT STS SHE HAS A HX OF SPINE SURGERY AND A DVT IN THE RIGHT LOWER LEG, AND IS NOT SURE IF SHE MAY HAVE ANOTHER CLOT, BECAUSE THE PAIN TRAVELS DOWN BOTH LEGS. DENIES SOB, BUT IS VERY ANXIOUS ABOUT THE SITUATION.

## 2016-01-28 NOTE — ED Provider Notes (Signed)
[facility] DEPT Provider Note   CSN: [id number] Arrival date & time: 01/28/16  1448     History   Chief Complaint Chief Complaint  Patient presents with  . Back Pain    LOWER    HPI [name] is a 65 y.o. female.  Pt presents to the ED today because of a pop she felt in her back.  She said that she put ice on it and it has improved.  Pt has a hx of degenerative disc disease in her back and DVT.  She was worried the pop was a dvt.  Pt's pain travels down both legs, but she is able to walk.  She has no bowel or bladder problems.      Past Medical History:  Diagnosis Date  . Arthritis    HX of chronic pain from Arthritis  . Cancer of skin    scalp  . DVT, lower extremity ([place])     There are no active problems to display for this patient.   Past Surgical History:  Procedure Laterality Date  . ABDOMINAL HYSTERECTOMY    . BACK SURGERY     disk surgery  . CHOLECYSTECTOMY    . SKIN BIOPSY      OB History    No data available       Home Medications    Prior to Admission medications   Medication Sig Start Date End Date Taking? Authorizing Provider  acetaminophen (EXTRA STRENGTH PAIN RELIEF) 500 MG tablet Take 500 mg by mouth 3 (three) times daily with meals.    Yes Historical Provider, MD  Ascorbic Acid (VITAMIN C PO) Take 1 tablet by mouth daily.   Yes Historical Provider, MD  B Complex-C (SUPER B COMPLEX PO) Take 1 tablet by mouth daily.   Yes Historical Provider, MD  Biotin (BIOTIN 5000) 5 MG CAPS Take 1 capsule by mouth 2 (two) times daily with breakfast and lunch.    Yes Historical Provider, MD  Calcium Carbonate-Vitamin D (CALCIUM + D) 600-200 MG-UNIT TABS Take 1 tablet by mouth 2 (two) times daily with breakfast and lunch.    Yes Historical Provider, MD  cholecalciferol (VITAMIN D) 1000 units tablet Take 1,000 Units by mouth 2 (two) times daily.   Yes Historical Provider, MD  Coenzyme Q10 (COQ10 PO) Take 1 tablet by mouth daily with breakfast.     Yes Historical Provider, MD  CRANBERRY PO Take 4,200 mg by mouth 2 (two) times daily.    Yes Historical Provider, MD  Cyanocobalamin (VITAMIN B-12) 2500 MCG SUBL Place 1 tablet under the tongue daily.   Yes Historical Provider, MD  diclofenac sodium (VOLTAREN) 1 % GEL Apply 2-4 g topically 4 (four) times daily as needed (for pain on shoulders and knees).   Yes Historical Provider, MD  ferrous sulfate 325 (65 FE) MG tablet Take 325 mg by mouth 2 (two) times daily with a meal.   Yes Historical Provider, MD  fexofenadine (ALLEGRA) 180 MG tablet Take 180 mg by mouth daily with breakfast.   Yes Historical Provider, MD  glucosamine-chondroitin 500-400 MG tablet Take 1 tablet by mouth 2 (two) times daily at 8 am and 10 pm.    Yes Historical Provider, MD  Lactobacillus (ACIDOPHILUS) CAPS capsule Take 1 capsule by mouth 3 (three) times daily.    Yes Historical Provider, MD  montelukast (SINGULAIR) 10 MG tablet Take 10 mg by mouth at bedtime.   Yes Historical Provider, MD  Nutritional Supplements (JUICE PLUS FIBRE PO)  Take 3 capsules by mouth 2 (two) times daily.   Yes Historical Provider, MD  Omega-3 Fatty Acids (FISH OIL) 1200 MG CAPS Take 1 capsule by mouth daily.   Yes Historical Provider, MD  omeprazole (PRILOSEC OTC) 20 MG tablet Take 20 mg by mouth daily with breakfast.   Yes Historical Provider, MD  pyridOXINE (VITAMIN B-6) 100 MG tablet Take 100 mg by mouth 2 (two) times daily.    Yes Historical Provider, MD  predniSONE (STERAPRED UNI-PAK 21 TAB) 10 MG (21) TBPK tablet Take 1 tablet (10 mg total) by mouth daily. Take 6 tabs by mouth daily  for 2 days, then 5 tabs for 2 days, then 4 tabs for 2 days, then 3 tabs for 2 days, 2 tabs for 2 days, then 1 tab by mouth daily for 2 days 01/28/16   Isla Pence, MD    Family History Family History  Problem Relation Age of Onset  . Pulmonary fibrosis Father   . COPD Mother     Social History Social History  Substance Use Topics  . Smoking status: Never  Smoker  . Smokeless tobacco: Never Used  . Alcohol use No     Allergies   Percocet [oxycodone-acetaminophen] and Penicillins   Review of Systems Review of Systems  Musculoskeletal: Positive for back pain.  All other systems reviewed and are negative.    Physical Exam Updated Vital Signs BP 156/77   Pulse 89   Temp 98.3 F (36.8 C) (Oral)   Resp 16   Ht 5' (1.524 m)   Wt 130 lb (59 kg)   SpO2 99%   BMI 25.39 kg/m   Physical Exam  Constitutional: She is oriented to person, place, and time. She appears well-developed and well-nourished.  HENT:  Head: Normocephalic and atraumatic.  Right Ear: External ear normal.  Left Ear: External ear normal.  Nose: Nose normal.  Mouth/Throat: Oropharynx is clear and moist.  Eyes: Conjunctivae and EOM are normal. Pupils are equal, round, and reactive to light.  Neck: Normal range of motion. Neck supple.  Cardiovascular: Normal rate, regular rhythm, normal heart sounds and intact distal pulses.   Pulmonary/Chest: Effort normal and breath sounds normal.  Abdominal: Soft. Bowel sounds are normal.  Musculoskeletal: Normal range of motion.  Neurological: She is alert and oriented to person, place, and time.  Skin: Skin is warm.  Psychiatric: She has a normal mood and affect. Her behavior is normal. Thought content normal.  Nursing note and vitals reviewed.    ED Treatments / Results  Labs (all labs ordered are listed, but only abnormal results are displayed) Labs Reviewed - No data to display  EKG  EKG Interpretation None       Radiology Dg Lumbar Spine Complete  Result Date: 01/28/2016 CLINICAL DATA:  Right-sided low back pain for 3 days. EXAM: LUMBAR SPINE - COMPLETE 4+ VIEW COMPARISON:  CT the abdomen and pelvis 01/07/2012. MRI of the lumbar spine 03/03/2005. FINDINGS: Moderate osteopenia is noted. Overlying bowel gas somewhat obscures the spine. Five non rib-bearing lumbar type vertebral bodies are present. Vertebral  body heights and alignment are maintained. No acute fracture traumatic subluxation is evident. Mild endplate degenerative changes are present at L3-4 and L5-S1 most notably. Facet degenerative changes are present at L5-S1, right greater left. Surgical clips are present at the gallbladder fossa. The lung bases are clear. Atherosclerotic calcifications are present in the aorta. IMPRESSION: 1. Stable degenerative changes within the lower lumbar spine. 2. No acute abnormality. 3.  Aortic atherosclerosis Electronically Signed   By: San Morelle M.D.   On: 01/28/2016 18:39    Procedures Procedures (including critical care time)  Medications Ordered in ED Medications  ketorolac (TORADOL) 30 MG/ML injection 30 mg (30 mg Intramuscular Given 01/28/16 1859)  dexamethasone (DECADRON) injection 10 mg (10 mg Intramuscular Given 01/28/16 1859)     Initial Impression / Assessment and Plan / ED Course  I have reviewed the triage vital signs and the nursing notes.  Pertinent labs & imaging results that were available during my care of the patient were reviewed by me and considered in my medical decision making (see chart for details).  Clinical Course    Pt is feeling better.  She will be d/c home with prednisone.  She knows to return if worse.  Final Clinical Impressions(s) / ED Diagnoses   Final diagnoses:  Strain of lumbar region, initial encounter    New Prescriptions New Prescriptions   PREDNISONE (STERAPRED UNI-PAK 21 TAB) 10 MG (21) TBPK TABLET    Take 1 tablet (10 mg total) by mouth daily. Take 6 tabs by mouth daily  for 2 days, then 5 tabs for 2 days, then 4 tabs for 2 days, then 3 tabs for 2 days, 2 tabs for 2 days, then 1 tab by mouth daily for 2 days     Isla Pence, MD 01/28/16 [phone]

## 2016-02-04 DIAGNOSIS — M5136 Other intervertebral disc degeneration, lumbar region: Secondary | ICD-10-CM | POA: Diagnosis not present

## 2016-02-04 DIAGNOSIS — M47816 Spondylosis without myelopathy or radiculopathy, lumbar region: Secondary | ICD-10-CM | POA: Diagnosis not present

## 2016-03-03 DIAGNOSIS — M47816 Spondylosis without myelopathy or radiculopathy, lumbar region: Secondary | ICD-10-CM | POA: Diagnosis not present

## 2016-03-08 DIAGNOSIS — M545 Low back pain: Secondary | ICD-10-CM | POA: Diagnosis not present

## 2016-03-09 ENCOUNTER — Encounter: Payer: Self-pay | Admitting: Family Medicine

## 2016-03-09 ENCOUNTER — Ambulatory Visit (INDEPENDENT_AMBULATORY_CARE_PROVIDER_SITE_OTHER): Payer: PPO | Admitting: Family Medicine

## 2016-03-09 VITALS — BP 148/74 | HR 76 | Resp 12 | Ht 60.0 in | Wt 131.5 lb

## 2016-03-09 DIAGNOSIS — K219 Gastro-esophageal reflux disease without esophagitis: Secondary | ICD-10-CM

## 2016-03-09 DIAGNOSIS — E559 Vitamin D deficiency, unspecified: Secondary | ICD-10-CM | POA: Diagnosis not present

## 2016-03-09 DIAGNOSIS — M159 Polyosteoarthritis, unspecified: Secondary | ICD-10-CM | POA: Diagnosis not present

## 2016-03-09 DIAGNOSIS — J309 Allergic rhinitis, unspecified: Secondary | ICD-10-CM

## 2016-03-09 DIAGNOSIS — D509 Iron deficiency anemia, unspecified: Secondary | ICD-10-CM

## 2016-03-09 DIAGNOSIS — R030 Elevated blood-pressure reading, without diagnosis of hypertension: Secondary | ICD-10-CM

## 2016-03-09 LAB — CBC
HCT: 39 % (ref 36.0–46.0)
Hemoglobin: 13.3 g/dL (ref 12.0–15.0)
MCHC: 34 g/dL (ref 30.0–36.0)
MCV: 94.8 fl (ref 78.0–100.0)
PLATELETS: 343 10*3/uL (ref 150.0–400.0)
RBC: 4.12 Mil/uL (ref 3.87–5.11)
RDW: 13.3 % (ref 11.5–15.5)
WBC: 9.7 10*3/uL (ref 4.0–10.5)

## 2016-03-09 LAB — BASIC METABOLIC PANEL
BUN: 6 mg/dL (ref 6–23)
CHLORIDE: 99 meq/L (ref 96–112)
CO2: 28 meq/L (ref 19–32)
Calcium: 9.9 mg/dL (ref 8.4–10.5)
Creatinine, Ser: 0.46 mg/dL (ref 0.40–1.20)
GFR: 143.96 mL/min (ref >60.00)
Glucose, Bld: 91 mg/dL (ref 70–99)
Potassium: 4.6 mEq/L (ref 3.5–5.1)
Sodium: 137 mEq/L (ref 135–145)

## 2016-03-09 LAB — VITAMIN D 25 HYDROXY (VIT D DEFICIENCY, FRACTURES): VITD: 51.63 ng/mL (ref 30.00–100.00)

## 2016-03-09 LAB — FERRITIN: Ferritin: 192.3 ng/mL (ref 10.0–291.0)

## 2016-03-09 MED ORDER — MONTELUKAST SODIUM 10 MG PO TABS: 10.0000 mg | ORAL_TABLET | Freq: Every day | ORAL | 3 refills | Status: DC

## 2016-03-09 NOTE — Progress Notes (Signed)
Pre visit review using our clinic review tool, if applicable. No additional management support is needed unless otherwise documented below in the visit note. 
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
## 2016-03-09 NOTE — Patient Instructions (Addendum)
A few things to remember from today's visit:   Elevated blood pressure reading  Vitamin D deficiency  Iron deficiency anemia, unspecified iron deficiency anemia type  Allergic rhinitis, unspecified chronicity, unspecified seasonality, unspecified trigger  Generalized osteoarthritis of multiple sites  Today 145/75 Monitor blood pressure at home.  Fall precautions.    Please be sure medication list is accurate. If a new problem present, please set up appointment sooner than planned today.

## 2016-03-10 ENCOUNTER — Encounter: Payer: Self-pay | Admitting: Family Medicine

## 2016-03-15 DIAGNOSIS — M545 Low back pain: Secondary | ICD-10-CM | POA: Diagnosis not present

## 2016-03-17 DIAGNOSIS — M545 Low back pain: Secondary | ICD-10-CM | POA: Diagnosis not present

## 2016-03-22 DIAGNOSIS — M545 Low back pain: Secondary | ICD-10-CM | POA: Diagnosis not present

## 2016-03-24 DIAGNOSIS — M545 Low back pain: Secondary | ICD-10-CM | POA: Diagnosis not present

## 2016-03-29 DIAGNOSIS — M545 Low back pain: Secondary | ICD-10-CM | POA: Diagnosis not present

## 2016-03-31 DIAGNOSIS — M47816 Spondylosis without myelopathy or radiculopathy, lumbar region: Secondary | ICD-10-CM | POA: Diagnosis not present

## 2016-04-01 ENCOUNTER — Ambulatory Visit (INDEPENDENT_AMBULATORY_CARE_PROVIDER_SITE_OTHER): Payer: PPO | Admitting: Family Medicine

## 2016-04-01 ENCOUNTER — Telehealth: Payer: Self-pay | Admitting: Family Medicine

## 2016-04-01 ENCOUNTER — Encounter: Payer: Self-pay | Admitting: Family Medicine

## 2016-04-01 VITALS — BP 132/80 | HR 88 | Temp 97.9°F | Resp 12 | Ht 60.0 in | Wt 131.0 lb

## 2016-04-01 DIAGNOSIS — B9789 Other viral agents as the cause of diseases classified elsewhere: Secondary | ICD-10-CM

## 2016-04-01 DIAGNOSIS — J309 Allergic rhinitis, unspecified: Secondary | ICD-10-CM | POA: Diagnosis not present

## 2016-04-01 DIAGNOSIS — R110 Nausea: Secondary | ICD-10-CM | POA: Diagnosis not present

## 2016-04-01 DIAGNOSIS — M545 Low back pain: Secondary | ICD-10-CM | POA: Diagnosis not present

## 2016-04-01 DIAGNOSIS — J069 Acute upper respiratory infection, unspecified: Secondary | ICD-10-CM | POA: Diagnosis not present

## 2016-04-01 DIAGNOSIS — H6981 Other specified disorders of Eustachian tube, right ear: Secondary | ICD-10-CM | POA: Diagnosis not present

## 2016-04-01 MED ORDER — ONDANSETRON HCL 4 MG PO TABS: 4.0000 mg | ORAL_TABLET | Freq: Three times a day (TID) | ORAL | 0 refills | Status: DC | PRN

## 2016-04-01 NOTE — Progress Notes (Signed)
Pre visit review using our clinic review tool, if applicable. No additional management support is needed unless otherwise documented below in the visit note. 

## 2016-04-01 NOTE — Telephone Encounter (Signed)
° ° °  Pt call to say her insurance company  will not pay for the following med and is asking if something else can be called in    ondansetron (ZOFRAN) 4 MG tablet     Pharmacy [facility]

## 2016-04-01 NOTE — Progress Notes (Signed)
HPI:   ACUTE VISIT:  Chief Complaint  Patient presents with  . Nausea  . head pressure    [name] is a 66 y.o. female, who is here today complaining of 5-6 days of nausea and nasal congestion.  + Frontal pressure headache, which has improved. +Postnasal drainage and clear rhinorrhea. Hx of allergic rhinitis, she is on Singulair 10 mg and Allegra 180 mg.  Symptoms otherwise stable.  Mother was admitted to hospital 03/25/16 to 03/30/16 Dx with influenza. She is her mother's caregiver and concerned about being around her if she is still contagious.   Nausea is intermittently, sometimes it is aggravated by food but sometimes it is alleviated by food. According to patient, she has had nausea intermittently since he underwent cholecystectomy, usually associated with diarrhea and resolved in a couple days.   Denies abdominal pain, vomiting, changes in bowel habits, blood in stool or melena. She has been treating symptoms with OTC TheraFlu.   She takes OTC Tylenol 500 mg for generalized osteoarthritis, she wonders if she has to stop Tylenol for a few days since she took TheraFlu twice yesterday along with her usual Tylenol dose. She has not taking any medication today.  She is also complaining of right ear fullness/pressure sensation and worsening tinnitus, she wonders if it is to be "cleaned out." Intermittent , denies worsening hearing loss, or ear trauma.    Review of Systems  Constitutional: Positive for chills and fatigue. Negative for appetite change and fever.  HENT: Positive for congestion, ear pain (right ear discomfort/pressure), postnasal drip, rhinorrhea, sinus pressure and tinnitus (chronic). Negative for facial swelling, mouth sores, sore throat, trouble swallowing and voice change.   Eyes: Negative for discharge, redness and itching.  Respiratory: Negative for cough, shortness of breath and wheezing.   Cardiovascular: Negative for chest pain.    Gastrointestinal: Positive for nausea. Negative for abdominal pain, diarrhea and vomiting.  Musculoskeletal: Positive for arthralgias (chronic). Negative for gait problem, joint swelling and myalgias.  Skin: Negative for rash.  Allergic/Immunologic: Positive for environmental allergies.  Neurological: Positive for headaches. Negative for syncope and weakness.  Hematological: Negative for adenopathy. Does not bruise/bleed easily.  Psychiatric/Behavioral: Negative for confusion. The patient is nervous/anxious.       Current Outpatient Prescriptions on File Prior to Visit  Medication Sig Dispense Refill  . acetaminophen (EXTRA STRENGTH PAIN RELIEF) 500 MG tablet Take 500 mg by mouth 3 (three) times daily with meals.     . B Complex-C (SUPER B COMPLEX PO) Take 1 tablet by mouth daily.    . Biotin (BIOTIN 5000) 5 MG CAPS Take 1 capsule by mouth 2 (two) times daily with breakfast and lunch.     . Calcium Carbonate-Vitamin D (CALCIUM + D) 600-200 MG-UNIT TABS Take 1 tablet by mouth 2 (two) times daily with breakfast and lunch.     . cholecalciferol (VITAMIN D) 1000 units tablet Take 1,000 Units by mouth 2 (two) times daily.    . Coenzyme Q10 (COQ10 PO) Take 1 tablet by mouth daily with breakfast.     . CRANBERRY PO Take 4,200 mg by mouth 2 (two) times daily.     . Cyanocobalamin (VITAMIN B-12) 2500 MCG SUBL 1 tablet daily.     . diclofenac sodium (VOLTAREN) 1 % GEL Apply 2-4 g topically 4 (four) times daily as needed (for pain on shoulders and knees).    . ferrous sulfate 325 (65 FE) MG tablet Take 325 mg by mouth  daily with breakfast.     . fexofenadine (ALLEGRA) 180 MG tablet Take 180 mg by mouth daily with breakfast.    . glucosamine-chondroitin 500-400 MG tablet Take 1 tablet by mouth 2 (two) times daily at 8 am and 10 pm.     . Lactobacillus (ACIDOPHILUS) CAPS capsule Take 1 capsule by mouth 3 (three) times daily.     . montelukast (SINGULAIR) 10 MG tablet Take 1 tablet (10 mg total) by  mouth at bedtime. 90 tablet 3  . Nutritional Supplements (JUICE PLUS FIBRE PO) Take 3 capsules by mouth 2 (two) times daily.    . Omega-3 Fatty Acids (FISH OIL) 1200 MG CAPS Take 1 capsule by mouth daily.    Marland Kitchen omeprazole (PRILOSEC OTC) 20 MG tablet Take 20 mg by mouth daily with breakfast.    . pyridOXINE (VITAMIN B-6) 100 MG tablet Take 100 mg by mouth 2 (two) times daily.      No current facility-administered medications on file prior to visit.      Past Medical History:  Diagnosis Date  . Arthritis    HX of chronic pain from Arthritis  . Cancer of skin    scalp  . DVT, lower extremity (HCC)    Allergies  Allergen Reactions  . Percocet [Oxycodone-Acetaminophen] Nausea And Vomiting  . Penicillins     Childhood reaction  Has patient had a PCN reaction causing immediate rash, facial/tongue/throat swelling, SOB or lightheadedness with hypotension: Unknown Has patient had a PCN reaction causing severe rash involving mucus membranes or skin necrosis: Unknown Has patient had a PCN reaction that required hospitalization: Unknown Has patient had a PCN reaction occurring within the last 10 years: Unknown If all of the above answers are "NO", then may proceed with Cephalosporin use.     Social History   Social History  . Marital status: Widowed    Spouse name: N/A  . Number of children: N/A  . Years of education: N/A   Social History Main Topics  . Smoking status: Never Smoker  . Smokeless tobacco: Never Used  . Alcohol use No  . Drug use: No  . Sexual activity: Not Asked   Other Topics Concern  . None   Social History Narrative  . None    Vitals:   04/01/16 1457  BP: 132/80  Pulse: 88  Resp: 12  Temp: 97.9 F (36.6 C)  O2 sat 95% at RA. Body mass index is 25.58 kg/m.   Physical Exam  Nursing note and vitals reviewed. Constitutional: She is oriented to person, place, and time. She appears well-developed and well-nourished. She does not appear ill. No  distress.  HENT:  Head: Atraumatic.  Right Ear: External ear and ear canal normal. Tympanic membrane is not erythematous. A middle ear effusion is present.  Left Ear: Tympanic membrane, external ear and ear canal normal.  Nose: Rhinorrhea present. Right sinus exhibits no frontal sinus tenderness. Left sinus exhibits no frontal sinus tenderness.  Mouth/Throat: Uvula is midline, oropharynx is clear and moist and mucous membranes are normal.  Hypertrophic turbinates. Mild tenderness upon pressing maxillary sinuses.  Eyes: Conjunctivae and EOM are normal.  Neck: No muscular tenderness present. No edema and no erythema present.  Cardiovascular: Normal rate and regular rhythm.   No murmur heard. Respiratory: Effort normal and breath sounds normal. No stridor. No respiratory distress.  GI: Soft. Bowel sounds are normal. She exhibits no mass. There is no hepatomegaly. There is no tenderness.  Musculoskeletal: She exhibits no edema.  Lymphadenopathy:    She has no cervical adenopathy.  Neurological: She is alert and oriented to person, place, and time. She has normal strength.  Skin: Skin is warm. No rash noted. No erythema.  Psychiatric: Her speech is normal. Her mood appears anxious.  Well groomed, good eye contact.      ASSESSMENT AND PLAN:     Ivy was seen today for nausea and head pressure.  Diagnoses and all orders for this visit:  Nausea without vomiting  We discussed possible etiologies, explained he can be associated with viral illness but could certainly be aggravated by GERD.  For now I don't think imaging or further work-up is necessary.  We will treat symptomatically with Zofran. Bland diet, small portions at the time recommended. Instructed about warning signs.  -     ondansetron (ZOFRAN) 4 MG tablet; Take 1 tablet (4 mg total) by mouth every 8 (eight) hours as needed for nausea or vomiting.  Viral upper respiratory tract infection  Symptoms suggests a viral  etiology, symptomatic treatment  Recommended. I do not think abx is needed at this time. She is concerned about her mother getting sick again, explained that since she is not having fever + symptoms for 5-6 days, I do not think she is longer contagious.  Instructed to monitor for signs of complications, including new onset of fever among some, clearly instructed about warning signs. I also explained that nasal congestion can last a few days and sometimes weeks. F/U as needed.   Eustachian tube dysfunction, right  OTC decongestant may help, some side effects discussed. Auto inflation maneuvers recommended a few times per day. F/U as needed.  Allergic rhinitis, unspecified chronicity, unspecified seasonality, unspecified trigger  Exacerbated by URI symptoms. Recommended trying OTC Rhinocort intranasal spray, continue nasal irrigations with saline, continue Allegra 180 mg daily and Singulair 10 mg daily. Steam inhalations might also help. Follow-up as needed.   In regard to Tylenol 500 mg ,she can resume it.  I do not thin she needs TheraFlu,so instructed to discontinue it.    -[name] was advised to return or notify a doctor immediately if symptoms worsen or persist or new concerns arise.       Betty G. Martinique, MD  [facility]. [place] office.

## 2016-04-01 NOTE — Patient Instructions (Addendum)
A few things to remember from today's visit:   Nausea without vomiting - Plan: ondansetron (ZOFRAN) 4 MG tablet  Allergic rhinitis, unspecified chronicity, unspecified seasonality, unspecified trigger  Viral upper respiratory tract infection  Eustachian tube dysfunction, right  -He can continue Tylenol 3 times per day as you been taken it for joint pain. Popping ears might help with right ear fullness sensation.  Zofran as needed 3 times per day for nausea.  Symptoms suggest viral infection, which is self-limited and we treat each symptom depending of severity.  Over the counter medications as decongestants and cold medications usually help, they need to be taken with caution if there is a history of high blood pressure or palpitations. May help with ear discomfort.   Plenty of fluids.  Steam inhalations helps with runny nose, nasal congestion, and may prevent sinus infections. Nasal congestion could last a few days and sometimes weeks. Please follow in not any better in 1-2 weeks or if symptoms get worse.  Allergies can also be causing nasal congestion and GERD can be aggravated nausea. Consider increasing omeprazole to once daily to twice daily, 30 minutes before meals.  Rhinocort nasal spray and continue Allegra 180 mg over-the-counter: These  might also help with congestion.   Please be sure medication list is accurate. If a new problem present, please set up appointment sooner than planned today.

## 2016-04-02 ENCOUNTER — Telehealth: Payer: Self-pay | Admitting: Family Medicine

## 2016-04-02 MED ORDER — PROMETHAZINE HCL 25 MG PO TABS: 25.0000 mg | ORAL_TABLET | Freq: Three times a day (TID) | ORAL | 0 refills | Status: DC | PRN

## 2016-04-02 NOTE — Telephone Encounter (Signed)
Patient Name: [name] DOB: Oct 06, 1950 Initial Comment Caller States she has some questions about the medication she was given for nausea Nurse Assessment Nurse: Ronnald Ramp, RN, [place] Date/Time (Eastern Time): 04/02/2016 11:39:20 AM Confirm and document reason for call. If symptomatic, describe symptoms. ---Caller states she was able to get this taken care of and thought the call had been cancelled. Does the patient have any new or worsening symptoms? ---No Guidelines Guideline Title Affirmed Question Affirmed Notes Final Disposition User Clinical Call Ronnald Ramp, RN, [employer]

## 2016-04-02 NOTE — Telephone Encounter (Signed)
Noted  

## 2016-04-02 NOTE — Telephone Encounter (Signed)
Pt need new Rx for

## 2016-04-02 NOTE — Telephone Encounter (Signed)
° ° ° °  Pt said after speaking with her insurance company she was able to pick up the zofran and will not be picking up the other one that was called in

## 2016-04-02 NOTE — Telephone Encounter (Signed)
Phenergan 25 mg tid as needed # 21/0. This one causes drowsiness.  Thanks, BJ

## 2016-04-02 NOTE — Telephone Encounter (Signed)
Rx sent 

## 2016-04-02 NOTE — Telephone Encounter (Signed)
Pt called to have it noted in her chart that she received both medications Zofran (yesterday) and promethazine (was delivered by the [facility] and it did not get cancelled in time she was at her mothers when the pharmacy delivered it).

## 2016-04-05 ENCOUNTER — Telehealth: Payer: Self-pay | Admitting: Family Medicine

## 2016-04-05 DIAGNOSIS — M545 Low back pain: Secondary | ICD-10-CM | POA: Diagnosis not present

## 2016-04-05 MED ORDER — DOXYCYCLINE HYCLATE 100 MG PO TABS: ORAL_TABLET | ORAL | 0 refills | Status: DC

## 2016-04-05 NOTE — Telephone Encounter (Signed)
Left detailed message on patient's voicemail with information below and Rx sent to pharmacy.

## 2016-04-05 NOTE — Telephone Encounter (Signed)
I saw her just recently, her mother had influenza recently. Examination at the time of her visit did not suggest a bacterial infection, which usually is treated with abx. If she is having facial pain or worsening "sinus" congestion, which is not unusual after URI and could last a few weeks; she could have Doxycycline 100 mg bid for 10 days.  Abx can cause GI symptoms and will not help with viral illness or allergic rhinitis symptoms.  Thanks, BJ

## 2016-04-05 NOTE — Telephone Encounter (Signed)
Pt would like to have when this message has be addressed it is okay to leave a detail message if she does not answer.

## 2016-04-05 NOTE — Telephone Encounter (Signed)
Please advise 

## 2016-04-05 NOTE — Telephone Encounter (Signed)
° ° ° °  Pt said the nausea medicine that was rx she can not take.  She called today to ask for something that does not have any side effects. 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 She said maybe a antibiotic will work .    [place]

## 2016-04-07 DIAGNOSIS — M545 Low back pain: Secondary | ICD-10-CM | POA: Diagnosis not present

## 2016-04-12 DIAGNOSIS — M545 Low back pain: Secondary | ICD-10-CM | POA: Diagnosis not present

## 2016-04-13 DIAGNOSIS — G5601 Carpal tunnel syndrome, right upper limb: Secondary | ICD-10-CM | POA: Diagnosis not present

## 2016-04-13 DIAGNOSIS — M47816 Spondylosis without myelopathy or radiculopathy, lumbar region: Secondary | ICD-10-CM | POA: Diagnosis not present

## 2016-04-13 DIAGNOSIS — M5136 Other intervertebral disc degeneration, lumbar region: Secondary | ICD-10-CM | POA: Diagnosis not present

## 2016-04-14 ENCOUNTER — Ambulatory Visit (INDEPENDENT_AMBULATORY_CARE_PROVIDER_SITE_OTHER): Payer: PPO | Admitting: Family Medicine

## 2016-04-14 ENCOUNTER — Encounter: Payer: Self-pay | Admitting: Family Medicine

## 2016-04-14 VITALS — BP 106/70 | HR 72 | Temp 97.6°F | Resp 12 | Ht 60.0 in | Wt 131.4 lb

## 2016-04-14 DIAGNOSIS — R110 Nausea: Secondary | ICD-10-CM | POA: Diagnosis not present

## 2016-04-14 DIAGNOSIS — J309 Allergic rhinitis, unspecified: Secondary | ICD-10-CM

## 2016-04-14 DIAGNOSIS — K219 Gastro-esophageal reflux disease without esophagitis: Secondary | ICD-10-CM | POA: Diagnosis not present

## 2016-04-14 DIAGNOSIS — R002 Palpitations: Secondary | ICD-10-CM

## 2016-04-14 LAB — CBC
HEMATOCRIT: 42.4 % (ref 36.0–46.0)
HEMOGLOBIN: 14.2 g/dL (ref 12.0–15.0)
MCHC: 33.5 g/dL (ref 30.0–36.0)
MCV: 96.7 fl (ref 78.0–100.0)
Platelets: 246 10*3/uL (ref 150.0–400.0)
RBC: 4.38 Mil/uL (ref 3.87–5.11)
RDW: 13.5 % (ref 11.5–15.5)
WBC: 10 10*3/uL (ref 4.0–10.5)

## 2016-04-14 LAB — COMPREHENSIVE METABOLIC PANEL
ALBUMIN: 4.8 g/dL (ref 3.5–5.2)
ALT: 25 U/L (ref 0–35)
AST: 30 U/L (ref 0–37)
Alkaline Phosphatase: 34 U/L — ABNORMAL LOW (ref 39–117)
BUN: 8 mg/dL (ref 6–23)
CHLORIDE: 99 meq/L (ref 96–112)
CO2: 31 mEq/L (ref 19–32)
CREATININE: 0.53 mg/dL (ref 0.40–1.20)
Calcium: 10.1 mg/dL (ref 8.4–10.5)
GFR: 122.21 mL/min (ref >60.00)
GLUCOSE: 89 mg/dL (ref 70–99)
Potassium: 3.8 mEq/L (ref 3.5–5.1)
SODIUM: 137 meq/L (ref 135–145)
TOTAL PROTEIN: 7.6 g/dL (ref 6.0–8.3)
Total Bilirubin: 0.5 mg/dL (ref 0.2–1.2)

## 2016-04-14 NOTE — Progress Notes (Signed)
HPI:   ACUTE VISIT:  Chief Complaint  Patient presents with  . chest congestion  . ear pressure    [name] is a 66 y.o. female, who is here today complaining of persistent upper respiratory symptoms and nausea.  I saw her last 04/01/2016 when she was complaining of 5-6 days of respiratory symptoms and nausea. Her mother had been just discharged from hospital with diagnosis of influenza. I recommended symptomatic treatment and Zofran for nausea.   She called same day because apparently Zofran was not covered by her insurance, so Phenergan was sent. According to patient, she called her insurance and finally Zofran was approved. She took Zofran once and aggravated her headache, so she discontinued. Phenergan causes drowsiness and afraid she may not be able to taking care of her mother.   She called on 04/02/2016 because symptoms were not any better, she was requesting antibiotic treatment. Doxycycline was sent to the pharmacy. She tells me that she didn't fill prescription.  She has not noted fever or chills. Mild nonproductive cough. Still having nasal congestion, rhinorrhea, postnasal drainage. She denies facial pain but has "tight" frontal sensation and nose pain.  She is still nauseated, sometimes alleviated by food intake. She is not sure about exacerbating factors.  She denies vomiting, abdominal pain, or changes in bowel habits. She states that after her cholecystectomy she has had intermittent nausea, which is alleviated by defecation; But is not similar to the one she is having now. She has history of GERD, last office visit omeprazole was increased from once daily to twice daily. He has not noted heartburn.  On 04/07/16 she had a couple episodes of palpitations and "fast" HR that happened while she was sitting and later when she was waiting in line at the pharmacy. Episodes lasted a few minutes, no associated dizziness, diaphoresis, or chest pain.  She  has Hx of anxiety and she thinks all these above symptoms may be related to stress. She has been taking care of her 15 yo mother, who is recovering well but has needed more help since hospital discharge.   Review of Systems  Constitutional: Positive for fatigue. Negative for appetite change and fever.  HENT: Positive for congestion, ear pain (pressure), postnasal drip, rhinorrhea and sinus pressure. Negative for facial swelling, mouth sores, sore throat, trouble swallowing and voice change.   Eyes: Negative for discharge and redness.  Respiratory: Positive for cough. Negative for shortness of breath and wheezing.   Cardiovascular: Positive for palpitations. Negative for chest pain and leg swelling.  Gastrointestinal: Positive for nausea. Negative for abdominal pain, diarrhea and vomiting.  Endocrine: Negative for cold intolerance and heat intolerance.  Genitourinary: Negative for decreased urine volume and hematuria.  Musculoskeletal: Positive for arthralgias (chronic). Negative for gait problem and neck pain.  Skin: Negative for rash.  Allergic/Immunologic: Positive for environmental allergies.  Neurological: Negative for dizziness, syncope and weakness.  Psychiatric/Behavioral: Negative for confusion and suicidal ideas. The patient is nervous/anxious.       Current Outpatient Prescriptions on File Prior to Visit  Medication Sig Dispense Refill  . acetaminophen (EXTRA STRENGTH PAIN RELIEF) 500 MG tablet Take 500 mg by mouth 3 (three) times daily with meals.     . B Complex-C (SUPER B COMPLEX PO) Take 1 tablet by mouth daily.    . Biotin (BIOTIN 5000) 5 MG CAPS Take 1 capsule by mouth 2 (two) times daily with breakfast and lunch.     . Calcium Carbonate-Vitamin  D (CALCIUM + D) 600-200 MG-UNIT TABS Take 1 tablet by mouth 2 (two) times daily with breakfast and lunch.     . cholecalciferol (VITAMIN D) 1000 units tablet Take 1,000 Units by mouth 2 (two) times daily.    . Coenzyme Q10 (COQ10  PO) Take 1 tablet by mouth daily with breakfast.     . CRANBERRY PO Take 4,200 mg by mouth 2 (two) times daily.     . Cyanocobalamin (VITAMIN B-12) 2500 MCG SUBL 1 tablet daily.     . diclofenac sodium (VOLTAREN) 1 % GEL Apply 2-4 g topically 4 (four) times daily as needed (for pain on shoulders and knees).    . ferrous sulfate 325 (65 FE) MG tablet Take 325 mg by mouth daily with breakfast.     . fexofenadine (ALLEGRA) 180 MG tablet Take 180 mg by mouth daily with breakfast.    . glucosamine-chondroitin 500-400 MG tablet Take 1 tablet by mouth 2 (two) times daily at 8 am and 10 pm.     . Lactobacillus (ACIDOPHILUS) CAPS capsule Take 1 capsule by mouth 3 (three) times daily.     . montelukast (SINGULAIR) 10 MG tablet Take 1 tablet (10 mg total) by mouth at bedtime. 90 tablet 3  . Nutritional Supplements (JUICE PLUS FIBRE PO) Take 3 capsules by mouth 2 (two) times daily.    . Omega-3 Fatty Acids (FISH OIL) 1200 MG CAPS Take 1 capsule by mouth daily.    Marland Kitchen omeprazole (PRILOSEC OTC) 20 MG tablet Take 20 mg by mouth daily with breakfast.    . pyridOXINE (VITAMIN B-6) 100 MG tablet Take 100 mg by mouth 2 (two) times daily.      No current facility-administered medications on file prior to visit.      Past Medical History:  Diagnosis Date  . Arthritis    HX of chronic pain from Arthritis  . Cancer of skin    scalp  . DVT, lower extremity (HCC)    Allergies  Allergen Reactions  . Percocet [Oxycodone-Acetaminophen] Nausea And Vomiting  . Penicillins     Childhood reaction  Has patient had a PCN reaction causing immediate rash, facial/tongue/throat swelling, SOB or lightheadedness with hypotension: Unknown Has patient had a PCN reaction causing severe rash involving mucus membranes or skin necrosis: Unknown Has patient had a PCN reaction that required hospitalization: Unknown Has patient had a PCN reaction occurring within the last 10 years: Unknown If all of the above answers are "NO", then  may proceed with Cephalosporin use.     Social History   Social History  . Marital status: Widowed    Spouse name: N/A  . Number of children: N/A  . Years of education: N/A   Social History Main Topics  . Smoking status: Never Smoker  . Smokeless tobacco: Never Used  . Alcohol use No  . Drug use: No  . Sexual activity: Not Asked   Other Topics Concern  . None   Social History Narrative  . None    Vitals:   04/14/16 0819  BP: 106/70  Pulse: 72  Resp: 12  Temp: 97.6 F (36.4 C)  O2 sat at RA 98%. Body mass index is 25.66 kg/m.   Physical Exam  Constitutional: She is oriented to person, place, and time. She appears well-developed and well-nourished. She does not appear ill. No distress.  HENT:  Head: Atraumatic.  Right Ear: Tympanic membrane, external ear and ear canal normal.  Left Ear: Tympanic membrane, external  ear and ear canal normal.  Nose: Rhinorrhea (clear) present. No sinus tenderness. Right sinus exhibits no maxillary sinus tenderness and no frontal sinus tenderness. Left sinus exhibits no maxillary sinus tenderness and no frontal sinus tenderness.  Mouth/Throat: Oropharynx is clear and moist and mucous membranes are normal.  Hypertrophic turbinates.  Eyes: Conjunctivae and EOM are normal.  Cardiovascular: Normal rate and regular rhythm.   Murmur (? soft SEM RUSB) heard. Pulses:      Dorsalis pedis pulses are 2+ on the right side, and 2+ on the left side.  Respiratory: Effort normal and breath sounds normal. No respiratory distress.  GI: Soft. She exhibits no mass. There is no hepatomegaly. There is no tenderness.  Musculoskeletal: She exhibits no edema.  Lymphadenopathy:    She has no cervical adenopathy.  Neurological: She is alert and oriented to person, place, and time. She has normal strength.  Skin: Skin is warm. No rash noted. No erythema.  Psychiatric: Her speech is normal. Her mood appears anxious.  Well groomed, good eye contact.     ASSESSMENT AND PLAN:   Sylvesta was seen today for chest congestion and ear pressure.  Diagnoses and all orders for this visit:  Nausea without vomiting  Possible etiologies discussed, it seems like she already has some chronic nausea after her cholecystectomy. She could try Zofran again , she prefers not to do so. Further recommendations will be given according to lab results.  -     Comprehensive metabolic panel -     CBC  Heart palpitations  EKG done today: NSR, normal axis and intervals. No other EKG available for comparison. Instructed about warning signs. ? Anxiety. F/U in 3 weeks.   -     EKG 12-Lead -     Comprehensive metabolic panel -     CBC  Allergic rhinitis, unspecified chronicity, unspecified seasonality, unspecified trigger  Some of symptoms she is reporting could be residual s/p URI but could also be related to allergies. Today no findings suggesting bacteria or active viral illness. Continue current management.  Gastroesophageal reflux disease, esophagitis presence not specified  Could be aggravating nausea. Options discussed, she does not want to change to a different PPI. GERD precautions discussed. If persistent nausea I may consider GI referral.     Face to face OV from 8:35 am to 9:00 am then 9:15 am to 9:21 am to discuss EKG.  > 50% of visit was dedicated to discussion of possible etiologies or reported symptoms, treatment options, medications side effects, and plan of care. She is very anxious and concerned about symptoms she is having, she needed reassurance.    Return in about 3 weeks (around 05/05/2016) for nausea,palpi.     -[name] was advised to return or notify a doctor immediately if symptoms worsen  or new concerns arise.       Ronn Smolinsky G. [place], MD  [facility]. [place] office.

## 2016-04-14 NOTE — Patient Instructions (Signed)
A few things to remember from today's visit:   Heart palpitations - Plan: EKG 12-Lead, Comprehensive metabolic panel, CBC  Allergic rhinitis, unspecified chronicity, unspecified seasonality, unspecified trigger  Gastroesophageal reflux disease, esophagitis presence not specified  Nausea without vomiting - Plan: Comprehensive metabolic panel, CBC    Avoid foods that make your symptoms worse, for example coffee, chocolate,pepermeint,alcohol, and greasy food. Raising the head of your bed about 6 inches may help with nocturnal symptoms.   Avoid lying down for 3 hours after eating.  Instead 3 large meals daily try small and more frequent meals during the day.    You should be evaluated immediately if bloody vomiting, bloody stools, black stools (like tar), difficulty swallowing, food gets stuck on the way down or choking when eating. Abnormal weight loss or severe abdominal pain.  If symptoms are not resolved sometimes endoscopy is necessary.  Please be sure medication list is accurate. If a new problem present, please set up appointment sooner than planned today.

## 2016-04-14 NOTE — Progress Notes (Signed)
Pre visit review using our clinic review tool, if applicable. No additional management support is needed unless otherwise documented below in the visit note. 

## 2016-04-15 DIAGNOSIS — M545 Low back pain: Secondary | ICD-10-CM | POA: Diagnosis not present

## 2016-04-19 DIAGNOSIS — M545 Low back pain: Secondary | ICD-10-CM | POA: Diagnosis not present

## 2016-04-20 ENCOUNTER — Ambulatory Visit (INDEPENDENT_AMBULATORY_CARE_PROVIDER_SITE_OTHER): Payer: PPO

## 2016-04-20 DIAGNOSIS — Z23 Encounter for immunization: Secondary | ICD-10-CM

## 2016-04-20 DIAGNOSIS — Z0000 Encounter for general adult medical examination without abnormal findings: Secondary | ICD-10-CM

## 2016-04-20 NOTE — Progress Notes (Signed)
I have reviewed documentation from this visit and I agree with recommendations given.  Jahlani Lorentz G. Che [place], MD  [facility]. [place] office.   

## 2016-04-20 NOTE — Patient Instructions (Addendum)
[name] , Thank you for taking time to come for your Medicare Wellness Visit. I appreciate your ongoing commitment to your health goals. Please review the following plan we discussed and let me know if I can assist you in the future.   Will take her PSV 23 when you fup with Dr. Martinique due to being ill for a month.   You had a dexa in 08/2014 and will be due again in 09/2016  Recommendations for Dexa Scan Female over the age of 36 Man age 68 or older If you broke a bone past the age of 47 Women menopausal age with risk factors (thin frame; smoker; hx of fx ) Post menopausal women under the age of 7 with risk factors A man age 16 to 56 with risk factors Other: Spine xray that is showing break of bone loss Back pain with possible break Height loss of 1/2 inch or more within one year Total loss in height of 1.5 inches from your original height Osteoporosis is between -2.5 or greater  Calcium 1272m with Vit D 800u per day; more as directed by physician Strength building exercises discussed; can include walking; housework; small weights or stretch bands; silver sneakers if access to the Y   Check for phone ( [employer]) that has 3 buttons for children; you can program for 911 or your number )   Call the [employer] Corporationfor assistance.   [facility] [place](volunteers did take older adults to the doctor's office )  Address: [address], [place] [address] Phone: [phone]  [place] [employer]; [phone] them about employment opportunities for seniors;  Sr. Line; 3782 130 7985Get resource to get information on any and all community programs for Seniors  (call for transportation options)  Community Health Response Program -3564-332-9518Public Health Dept; Need to be a skilled visit but can assist with bathing as well; [phone] Adult center for Enrichment;  Call Senior Line; [phone] Adult day services include Adult Day Care, Adult Day  Healthcare, Group Respite, Care Partners, Volunteer In [place] Education and Support Program   Dept of Social Services; Call [phone])[phone] ask for SW on call  Options for Medicaid include the Community Alternatives program; [place] (personal care services) or PACE program, which is a medical and social program combined  KBraulio Contemanages the community Alternatives program at the PE. [employer] 3062-376-2831(this is a program with a waiting list but provides SNF care at home;  [facility] required; Call KClaiborne Billingsand she will send out packet of information   Caregiver support group and information regarding LLaceyis at the; [facility]ddress: [address] [place]  [id number] Phone: [phone] available assisted area in the community   HMobileCycles.plgeneral resources for food etc    Deaf & Hard of Hearing Division Services - can assist with hearing aid x 1  No reviews  [employer]  [address]  [phone]   These are the goals we discussed: Goals    . Exercise 150 minutes per week (moderate activity)          Will have an exercise plan of strengthening once you graduate from PT    . patient          Go to the gym; exercise more Keep searching for job        This is a list  of the screening recommended for you and due dates:  Health Maintenance  Topic Date Due  . Pneumonia vaccines (2 of 2 - PPSV23) 10/30/2015  . Mammogram  08/29/2017  . Tetanus Vaccine  04/22/2021  . Colon Cancer Screening  11/28/2023  . Flu Shot  Completed  . DEXA scan (bone density measurement)  Completed  .  Hepatitis C: One time screening is recommended by Center for Disease Control  (CDC) for  adults born from [birth year] through 1965.   Completed        Fall Prevention in the Home Falls can cause injuries. They can happen to people of all ages. There are many things you can do to make your  home safe and to help prevent falls. What can I do on the outside of my home?  Regularly fix the edges of walkways and driveways and fix any cracks.  Remove anything that might make you trip as you walk through a door, such as a raised step or threshold.  Trim any bushes or trees on the path to your home.  Use bright outdoor lighting.  Clear any walking paths of anything that might make someone trip, such as rocks or tools.  Regularly check to see if handrails are loose or broken. Make sure that both sides of any steps have handrails.  Any raised decks and porches should have guardrails on the edges.  Have any leaves, snow, or ice cleared regularly.  Use sand or salt on walking paths during winter.  Clean up any spills in your garage right away. This includes oil or grease spills. What can I do in the bathroom?  Use night lights.  Install grab bars by the toilet and in the tub and shower. Do not use towel bars as grab bars.  Use non-skid mats or decals in the tub or shower.  If you need to sit down in the shower, use a plastic, non-slip stool.  Keep the floor dry. Clean up any water that spills on the floor as soon as it happens.  Remove soap buildup in the tub or shower regularly.  Attach bath mats securely with double-sided non-slip rug tape.  Do not have throw rugs and other things on the floor that can make you trip. What can I do in the bedroom?  Use night lights.  Make sure that you have a light by your bed that is easy to reach.  Do not use any sheets or blankets that are too big for your bed. They should not hang down onto the floor.  Have a firm chair that has side arms. You can use this for support while you get dressed.  Do not have throw rugs and other things on the floor that can make you trip. What can I do in the kitchen?  Clean up any spills right away.  Avoid walking on wet floors.  Keep items that you use a lot in easy-to-reach places.  If  you need to reach something above you, use a strong step stool that has a grab bar.  Keep electrical cords out of the way.  Do not use floor polish or wax that makes floors slippery. If you must use wax, use non-skid floor wax.  Do not have throw rugs and other things on the floor that can make you trip. What can I do with my stairs?  Do not leave any items on the stairs.  Make sure that there are handrails on both sides of the  stairs and use them. Fix handrails that are broken or loose. Make sure that handrails are as long as the stairways.  Check any carpeting to make sure that it is firmly attached to the stairs. Fix any carpet that is loose or worn.  Avoid having throw rugs at the top or bottom of the stairs. If you do have throw rugs, attach them to the floor with carpet tape.  Make sure that you have a light switch at the top of the stairs and the bottom of the stairs. If you do not have them, ask someone to add them for you. What else can I do to help prevent falls?  Wear shoes that:  Do not have high heels.  Have rubber bottoms.  Are comfortable and fit you well.  Are closed at the toe. Do not wear sandals.  If you use a stepladder:  Make sure that it is fully opened. Do not climb a closed stepladder.  Make sure that both sides of the stepladder are locked into place.  Ask someone to hold it for you, if possible.  Clearly mark and make sure that you can see:  Any grab bars or handrails.  First and last steps.  Where the edge of each step is.  Use tools that help you move around (mobility aids) if they are needed. These include:  Canes.  Walkers.  Scooters.  Crutches.  Turn on the lights when you go into a dark area. Replace any light bulbs as soon as they burn out.  Set up your furniture so you have a clear path. Avoid moving your furniture around.  If any of your floors are uneven, fix them.  If there are any pets around you, be aware of where  they are.  Review your medicines with your doctor. Some medicines can make you feel dizzy. This can increase your chance of falling. Ask your doctor what other things that you can do to help prevent falls. This information is not intended to replace advice given to you by your health care provider. Make sure you discuss any questions you have with your health care provider. Document Released: 12/05/2008 Document Revised: 07/17/2015 Document Reviewed: 03/15/2014 Elsevier Interactive Patient Education  2017 [employer] Maintenance, Female Adopting a healthy lifestyle and getting preventive care can go a long way to promote health and wellness. Talk with your health care provider about what schedule of regular examinations is right for you. This is a good chance for you to check in with your provider about disease prevention and staying healthy. In between checkups, there are plenty of things you can do on your own. Experts have done a lot of research about which lifestyle changes and preventive measures are most likely to keep you healthy. Ask your health care provider for more information. Weight and diet Eat a healthy diet  Be sure to include plenty of vegetables, fruits, low-fat dairy products, and lean protein.  Do not eat a lot of foods high in solid fats, added sugars, or salt.  Get regular exercise. This is one of the most important things you can do for your health.  Most adults should exercise for at least 150 minutes each week. The exercise should increase your heart rate and make you sweat (moderate-intensity exercise).  Most adults should also do strengthening exercises at least twice a week. This is in addition to the moderate-intensity exercise. Maintain a healthy weight  Body mass index (BMI) is a measurement that  can be used to identify possible weight problems. It estimates body fat based on height and weight. Your health care provider can help determine your BMI  and help you achieve or maintain a healthy weight.  For females 68 years of age and older:  A BMI below 18.5 is considered underweight.  A BMI of 18.5 to 24.9 is normal.  A BMI of 25 to 29.9 is considered overweight.  A BMI of 30 and above is considered obese. Watch levels of cholesterol and blood lipids  You should start having your blood tested for lipids and cholesterol at 68 years of age, then have this test every 5 years.  You may need to have your cholesterol levels checked more often if:  Your lipid or cholesterol levels are high.  You are older than 68 years of age.  You are at high risk for heart disease. Cancer screening Lung Cancer  Lung cancer screening is recommended for adults 27-42 years old who are at high risk for lung cancer because of a history of smoking.  A yearly low-dose CT scan of the lungs is recommended for people who:  Currently smoke.  Have quit within the past 15 years.  Have at least a 30-pack-year history of smoking. A pack year is smoking an average of one pack of cigarettes a day for 1 year.  Yearly screening should continue until it has been 15 years since you quit.  Yearly screening should stop if you develop a health problem that would prevent you from having lung cancer treatment. Breast Cancer  Practice breast self-awareness. This means understanding how your breasts normally appear and feel.  It also means doing regular breast self-exams. Let your health care provider know about any changes, no matter how small.  If you are in your 20s or 30s, you should have a clinical breast exam (CBE) by a health care provider every 1-3 years as part of a regular health exam.  If you are 15 or older, have a CBE every year. Also consider having a breast X-ray (mammogram) every year.  If you have a family history of breast cancer, talk to your health care provider about genetic screening.  If you are at high risk for breast cancer, talk to your  health care provider about having an MRI and a mammogram every year.  Breast cancer gene (BRCA) assessment is recommended for women who have family members with BRCA-related cancers. BRCA-related cancers include:  Breast.  Ovarian.  Tubal.  Peritoneal cancers.  Results of the assessment will determine the need for genetic counseling and BRCA1 and BRCA2 testing. Cervical Cancer  Your health care provider may recommend that you be screened regularly for cancer of the pelvic organs (ovaries, uterus, and vagina). This screening involves a pelvic examination, including checking for microscopic changes to the surface of your cervix (Pap test). You may be encouraged to have this screening done every 3 years, beginning at age 47.  For women ages 1-65, health care providers may recommend pelvic exams and Pap testing every 3 years, or they may recommend the Pap and pelvic exam, combined with testing for human papilloma virus (HPV), every 5 years. Some types of HPV increase your risk of cervical cancer. Testing for HPV may also be done on women of any age with unclear Pap test results.  Other health care providers may not recommend any screening for nonpregnant women who are considered low risk for pelvic cancer and who do not have symptoms. Ask your  health care provider if a screening pelvic exam is right for you.  If you have had past treatment for cervical cancer or a condition that could lead to cancer, you need Pap tests and screening for cancer for at least 20 years after your treatment. If Pap tests have been discontinued, your risk factors (such as having a new sexual partner) need to be reassessed to determine if screening should resume. Some women have medical problems that increase the chance of getting cervical cancer. In these cases, your health care provider may recommend more frequent screening and Pap tests. Colorectal Cancer  This type of cancer can be detected and often  prevented.  Routine colorectal cancer screening usually begins at 68 years of age and continues through 68 years of age.  Your health care provider may recommend screening at an earlier age if you have risk factors for colon cancer.  Your health care provider may also recommend using home test kits to check for hidden blood in the stool.  A small camera at the end of a tube can be used to examine your colon directly (sigmoidoscopy or colonoscopy). This is done to check for the earliest forms of colorectal cancer.  Routine screening usually begins at age 40.  Direct examination of the colon should be repeated every 5-10 years through 68 years of age. However, you may need to be screened more often if early forms of precancerous polyps or small growths are found. Skin Cancer  Check your skin from head to toe regularly.  Tell your health care provider about any new moles or changes in moles, especially if there is a change in a mole's shape or color.  Also tell your health care provider if you have a mole that is larger than the size of a pencil eraser.  Always use sunscreen. Apply sunscreen liberally and repeatedly throughout the day.  Protect yourself by wearing long sleeves, pants, a wide-brimmed hat, and sunglasses whenever you are outside. Heart disease, diabetes, and high blood pressure  High blood pressure causes heart disease and increases the risk of stroke. High blood pressure is more likely to develop in:  People who have blood pressure in the high end of the normal range (130-139/85-89 mm Hg).  People who are overweight or obese.  People who are African American.  If you are 58-73 years of age, have your blood pressure checked every 3-5 years. If you are 14 years of age or older, have your blood pressure checked every year. You should have your blood pressure measured twice-once when you are at a hospital or clinic, and once when you are not at a hospital or clinic. Record  the average of the two measurements. To check your blood pressure when you are not at a hospital or clinic, you can use:  An automated blood pressure machine at a pharmacy.  A home blood pressure monitor.  If you are between 88 years and 28 years old, ask your health care provider if you should take aspirin to prevent strokes.  Have regular diabetes screenings. This involves taking a blood sample to check your fasting blood sugar level.  If you are at a normal weight and have a low risk for diabetes, have this test once every three years after 68 years of age.  If you are overweight and have a high risk for diabetes, consider being tested at a younger age or more often. Preventing infection Hepatitis B  If you have a higher risk for  hepatitis B, you should be screened for this virus. You are considered at high risk for hepatitis B if:  You were born in a country where hepatitis B is common. Ask your health care provider which countries are considered high risk.  Your parents were born in a high-risk country, and you have not been immunized against hepatitis B (hepatitis B vaccine).  You have HIV or AIDS.  You use needles to inject street drugs.  You live with someone who has hepatitis B.  You have had sex with someone who has hepatitis B.  You get hemodialysis treatment.  You take certain medicines for conditions, including cancer, organ transplantation, and autoimmune conditions. Hepatitis C  Blood testing is recommended for:  Everyone born from [birth year] through 1967.  Anyone with known risk factors for hepatitis C. Sexually transmitted infections (STIs)  You should be screened for sexually transmitted infections (STIs) including gonorrhea and chlamydia if:  You are sexually active and are younger than 68 years of age.  You are older than 68 years of age and your health care provider tells you that you are at risk for this type of infection.  Your sexual activity has  changed since you were last screened and you are at an increased risk for chlamydia or gonorrhea. Ask your health care provider if you are at risk.  If you do not have HIV, but are at risk, it may be recommended that you take a prescription medicine daily to prevent HIV infection. This is called pre-exposure prophylaxis (PrEP). You are considered at risk if:  You are sexually active and do not regularly use condoms or know the HIV status of your partner(s).  You take drugs by injection.  You are sexually active with a partner who has HIV. Talk with your health care provider about whether you are at high risk of being infected with HIV. If you choose to begin PrEP, you should first be tested for HIV. You should then be tested every 3 months for as long as you are taking PrEP. Pregnancy  If you are premenopausal and you may become pregnant, ask your health care provider about preconception counseling.  If you may become pregnant, take 400 to 800 micrograms (mcg) of folic acid every day.  If you want to prevent pregnancy, talk to your health care provider about birth control (contraception). Osteoporosis and menopause  Osteoporosis is a disease in which the bones lose minerals and strength with aging. This can result in serious bone fractures. Your risk for osteoporosis can be identified using a bone density scan.  If you are 40 years of age or older, or if you are at risk for osteoporosis and fractures, ask your health care provider if you should be screened.  Ask your health care provider whether you should take a calcium or vitamin D supplement to lower your risk for osteoporosis.  Menopause may have certain physical symptoms and risks.  Hormone replacement therapy may reduce some of these symptoms and risks. Talk to your health care provider about whether hormone replacement therapy is right for you. Follow these instructions at home:  Schedule regular health, dental, and eye  exams.  Stay current with your immunizations.  Do not use any tobacco products including cigarettes, chewing tobacco, or electronic cigarettes.  If you are pregnant, do not drink alcohol.  If you are breastfeeding, limit how much and how often you drink alcohol.  Limit alcohol intake to no more than 1 drink per day for  nonpregnant women. One drink equals 12 ounces of beer, 5 ounces of wine, or 1 ounces of hard liquor.  Do not use street drugs.  Do not share needles.  Ask your health care provider for help if you need support or information about quitting drugs.  Tell your health care provider if you often feel depressed.  Tell your health care provider if you have ever been abused or do not feel safe at home. This information is not intended to replace advice given to you by your health care provider. Make sure you discuss any questions you have with your health care provider. Document Released: 08/24/2010 Document Revised: 07/17/2015 Document Reviewed: 11/12/2014 Elsevier Interactive Patient Education  2017 [employer].

## 2016-04-20 NOTE — Progress Notes (Addendum)
Subjective:   [name] is a 66 y.o. female who presents for Medicare Annual (Subsequent) preventive examination.  The Patient was informed that the wellness visit is to identify future health risk and educate and initiate measures that can reduce risk for increased disease through the lifespan.    NO ROS; Medicare Wellness Visit Lives in the same apt complex her 69 yo mother lives in . States her spouse passed away in 06/11/2010; and she misses him Moved here from [place] to fup on the care giving role   Was helping Therapist, nutritional for work  Job ended and now trying to find work to support her income.  Lives in downstairs apt  Checks on her mother all the time who also lives in downstairs apt. Now mother, who just had the flu and IP stays needs more help She herself is in PT for back issues   Needs resources and spent 20  Minutes in community resources.    Describes health as good, fair or great? fair  Preventive Screening -Counseling & Management  Mammogram 08/2015/ has mammogram at [facility] annually  07.2016; DEXA  -2.1 / agreed to have repeat dexa this year in July of 2018   Smoking history - never smoked  Second Hand Smoke status; No Smokers in the home ETOH - no  Medication adherence or issues? Brought in list of meds and hx.    RISK FACTORS Diet  Eats the same thing every day Breakfast oatmeal and rasins and banana Salad at lunch; raisins, small apple or canned chicken Dinner; [employer] chicken corn chowder;  Pack of peanut butter crackers and apple sauce  Regular exercise varies;  Has been going to [facility]) or PT for back  Walking some  Insurance pays for the silver sneakers.  Cardiac Risk Factors:  Advanced age ; >82 in women Hyperlipidemia - 12/2014 chol 203; trig 60; HDL 81; LDL 110  Diabetes -neg Family History  Copd; pul fibrosis    Fall risk (hx of back surgery) no Given education on "Fall Prevention in the Home" for more safety tips the  patient can apply as appropriate.  Long term goal is to "age in place" in apt now  Mobility of Functional changes this year?  Has a hard time making up the bed; has to keep back straight  OA in shoulders and knees  Safety; community, wears sunscreen 3 surgeries for basal sale; safe place for firearms doesn't have these  Motor vehicle accidents; no  Mental Health:  Hx of depression' has counselor available  Does have PTSD and being treated;  Any emotional problems? Anxious, depressed, irritable, sad or blue? No states she is managing for now.  Works through it for now [employer] her spouse  Denies feeling depressed or hopeless; voices pleasure in daily life   Activities of Daily Living - See functional screen   Cognitive testing; denies any issues with memory  No failures of daily living  Ad8 score; 0 or less than 2  MMSE deferred or completed if AD8 + 2 issues  Advanced Directives yes, her dtr Alden Benjamin Is HCPOA   Patient Care Team: Betty G Martinique, MD as PCP - General (Family Medicine) Periodontitis; Dr. Mohammed Kindle [phone] Dr. Teena Irani; [phone] PTSD; Under md care; [place] [phone]  Immunization History  Administered Date(s) Administered  . Influenza-Unspecified 12/01/2015  . Pneumococcal Conjugate-13 10/30/2014  . Tdap 04/23/2011  . Zoster 10/16/2009   Required Immunizations needed today  Screening test up to date or reviewed for plan of completion Health Maintenance Due  Topic Date Due  . PNA vac Low Risk Adult (2 of 2 - PPSV23) 10/30/2015   Discussed psv 23. Was going to take today, but has been sick; the patient decided to wait until her visit with Dr. Martinique as she states she would get anxious if she had an side effects.  Mother in the hospital with flu; no pneumonia         Objective:     Vitals: BP 110/60   Pulse 76   Ht 5' (1.524 m)   Wt 130 lb 3 oz (59.1 kg)   SpO2 96%   BMI 25.43 kg/m   Body mass  index is 25.43 kg/m.   Tobacco History  Smoking Status  . Never Smoker  Smokeless Tobacco  . Never Used     Counseling given: Yes   Past Medical History:  Diagnosis Date  . Arthritis    HX of chronic pain from Arthritis  . Cancer of skin    scalp  . DVT, lower extremity [facility])    Past Surgical History:  Procedure Laterality Date  . ABDOMINAL HYSTERECTOMY    . BACK SURGERY     disk surgery  . CHOLECYSTECTOMY    . SKIN BIOPSY     Family History  Problem Relation Age of Onset  . Pulmonary fibrosis Father   . COPD Mother    History  Sexual Activity  . Sexual activity: Not on file    Outpatient Encounter Prescriptions as of 04/20/2016  Medication Sig  . acetaminophen (EXTRA STRENGTH PAIN RELIEF) 500 MG tablet Take 500 mg by mouth 3 (three) times daily with meals.   . B Complex-C (SUPER B COMPLEX PO) Take 1 tablet by mouth daily.  . Biotin (BIOTIN 5000) 5 MG CAPS Take 1 capsule by mouth 2 (two) times daily with breakfast and lunch.   . Calcium Carbonate-Vitamin D (CALCIUM + D) 600-200 MG-UNIT TABS Take 1 tablet by mouth 2 (two) times daily with breakfast and lunch.   . cholecalciferol (VITAMIN D) 1000 units tablet Take 1,000 Units by mouth 2 (two) times daily.  . Coenzyme Q10 (COQ10 PO) Take 1 tablet by mouth daily with breakfast.   . CRANBERRY PO Take 4,200 mg by mouth 2 (two) times daily.   . Cyanocobalamin (VITAMIN B-12) 2500 MCG SUBL 1 tablet daily.   . diclofenac sodium (VOLTAREN) 1 % GEL Apply 2-4 g topically 4 (four) times daily as needed (for pain on shoulders and knees).  . ferrous sulfate 325 (65 FE) MG tablet Take 325 mg by mouth daily with breakfast.   . fexofenadine (ALLEGRA) 180 MG tablet Take 180 mg by mouth daily with breakfast.  . glucosamine-chondroitin 500-400 MG tablet Take 1 tablet by mouth 2 (two) times daily at 8 am and 10 pm.   . Lactobacillus (ACIDOPHILUS) CAPS capsule Take 1 capsule by mouth 3 (three) times daily.   . montelukast (SINGULAIR) 10  MG tablet Take 1 tablet (10 mg total) by mouth at bedtime.  . Nutritional Supplements (JUICE PLUS FIBRE PO) Take 3 capsules by mouth 2 (two) times daily.  . Omega-3 Fatty Acids (FISH OIL) 1200 MG CAPS Take 1 capsule by mouth daily.  Marland Kitchen omeprazole (PRILOSEC OTC) 20 MG tablet Take 20 mg by mouth daily with breakfast.  . pyridOXINE (VITAMIN B-6) 100 MG tablet Take 100 mg by mouth 2 (two) times daily.    No facility-administered encounter  medications on file as of 04/20/2016.     Activities of Daily Living In your present state of health, do you have any difficulty performing the following activities: 04/20/2016  Hearing? Y  Vision? Y  Difficulty concentrating or making decisions? N  Walking or climbing stairs? N  Dressing or bathing? N  Doing errands, shopping? N  Preparing Food and eating ? N  Using the Toilet? N  In the past six months, have you accidently leaked urine? N  Do you have problems with loss of bowel control? N  Managing your Medications? N  Managing your Finances? N  Housekeeping or managing your Housekeeping? N  Some recent data might be hidden    Patient Care Team: Betty G Martinique, MD as PCP - General (Family Medicine)    Assessment:     Exercise Activities and Dietary recommendations Current Exercise Habits: Home exercise routine, Type of exercise: walking (currently IN PT)  Goals    . Exercise 150 minutes per week (moderate activity)          Will have an exercise plan of strengthening once you graduate from PT    . patient          Go to the gym; exercise more Keep searching for job       Fall Risk Fall Risk  04/20/2016  Falls in the past year? No   Depression Screen PHQ 2/9 Scores 04/20/2016  PHQ - 2 Score 0     Cognitive Function MMSE - Mini Mental State Exam 04/20/2016  Not completed: (No Data)    AD8 score is 0     Immunization History  Administered Date(s) Administered  . Influenza-Unspecified 12/01/2015  . Pneumococcal Conjugate-13  10/30/2014  . Tdap 04/23/2011  . Zoster 10/16/2009   Screening Tests Health Maintenance  Topic Date Due  . PNA vac Low Risk Adult (2 of 2 - PPSV23) 10/30/2015  . MAMMOGRAM  08/29/2017  . TETANUS/TDAP  04/22/2021  . COLONOSCOPY  11/28/2023  . INFLUENZA VACCINE  Completed  . DEXA SCAN  Completed  . Hepatitis C Screening  Completed      Plan:     PCP Notes  Health Maintenance PSV 23 postponed until her march apt due to recent illness   Abnormal Screens  Dexa was -2.0 and agreed to have repeat this year when due  Taking Calcium and Vit d In PT now for back issues but to get home exercise plan when discharged   Referrals:none  Patient concerns; resources; came to discuss groceries assistance and help with finding a job. Did spend approx 20 minutes on resources   Nurse Concerns; no   Next PCP apt 05/05/2016     During the course of the visit the patient was educated and counseled about the following appropriate screening and preventive services:   Vaccines to include Pneumoccal, Influenza, Hepatitis B, Td, Zostavax, HCV  Electrocardiogram  Cardiovascular Disease  Colorectal cancer screening  Bone density screening  Diabetes screening  Glaucoma screening/ eye checked annually  Mammography/[place]ully   Nutrition counseling  / Patient Instructions (the written plan) was given to the patient.   [id number], RN  04/20/2016   Addended to update meds addended to note that history, allergies and immunizations scanned to chart

## 2016-04-23 DIAGNOSIS — M545 Low back pain: Secondary | ICD-10-CM | POA: Diagnosis not present

## 2016-04-30 ENCOUNTER — Telehealth: Payer: Self-pay | Admitting: Family Medicine

## 2016-04-30 NOTE — Telephone Encounter (Signed)
Pt would like to know what her blood type is?  Pt would to have a call with the information if she does not answer pls leave a detail msg on the phone.

## 2016-04-30 NOTE — Telephone Encounter (Signed)
Called and advised that patient should check with her insurance company to see if they will cover the test. Also advised that she could go to [facility] and they could draw it for her for $29. Patient said she would check with her insurance company.

## 2016-04-30 NOTE — Telephone Encounter (Signed)
I know patient can check with the [facility] (if she's ever had blood drawn by them.) Are you okay with signing on the order for a blood test if she wants to do it that way?

## 2016-04-30 NOTE — Telephone Encounter (Signed)
Pt state that the insurance [employer] need to have the name of the test and the code.   Insurance # 1 [phone] Rep   [phone] Automated services.

## 2016-05-04 ENCOUNTER — Telehealth: Payer: Self-pay | Admitting: Family Medicine

## 2016-05-04 NOTE — Telephone Encounter (Signed)
Noted  

## 2016-05-04 NOTE — Telephone Encounter (Signed)
° ° ° °  FYI : Pt said she found out what her blood type

## 2016-05-04 NOTE — Telephone Encounter (Signed)
Called and spoke with insurance company. They said they only cover what Medicare will cover. Patient has an appointment tomorrow, will go over information with her then.

## 2016-05-05 ENCOUNTER — Ambulatory Visit: Payer: PPO | Admitting: Family Medicine

## 2016-05-23 NOTE — Progress Notes (Signed)
HPI:   Ms.Neah Margurite Duffy is a 66 y.o. female, who is here today to follow on recent OV.   She was seen on 04/14/16, when she was c/o persistent nausea,nasal congestion,rhinorrhea,and heart palpitations. Heart palpitations resolved. Denies chest pain or dyspnea.   She did not tolerate Phenergan or Zofran. Hx of GERD and allergic rhinitis. She discontinued Singulair and Allegra,started plain Mucinex.Symptoms resolved while she took Mucinex,stopped and symptoms re-occurred but not as bad: mild frontal pressure,rhinorrhea, and post nasals drainage. She has not noted fever,chills,or sore throat. Cough has resolved.   Nausea is back to her baseline,she has chronic nausea since she underwent chlecystectomy. She is on Prilosec , which I recommended increasing last OV, 20 mg bid.She states that she was told this medication causes osteoporosis,so she wonders of she needs to stop it.   Denies abdominal pain, vomiting, changes in bowel habits, blood in stool or melena.    Lab Results  Component Value Date   CREATININE 0.53 04/14/2016   BUN 8 04/14/2016   NA 137 04/14/2016   K 3.8 04/14/2016   CL 99 04/14/2016   CO2 31 04/14/2016    Concerns today: ear pruritus, RUE tinging and hand numbness, shoulder pain, and pruritic skin rash.   -Bilateral ear pruritus,intermittently for a while,R>L. No changes in hearing,earache,or ear drainage.  -Bilateral shoulder pain,exacerbated by movement and alleviated by rest. She has Hx of OA,takes tylenol and was following with ortho.She states that now she is supposed to follow as needed. C/O right upper extremity numbness and tingling sensation. Mainly hand,wakes her up in the middle of the night (3 am) but also happens during the day.She moves hand and has a soft ball she uses for hand exercises,which helps.Also running warm water in the morning helps with wrist stiffness and achy sensation.  It has been worse for the past 2  weeks,dropping objects occasionally when using right hand.  Tingling also proximal right arm,from shoulder to mid arm,also for a while.  Hx of cervicalgia,which is not radiated. Denies focal weakness.   No recent trauma and minimal limitation of ROM.  She uses Voltaren gel for joint pain (shoulders and knees). -Pruritis skin rash,she has had for at least a year,usually around this time of the year. On back and sides of waits.It is not tender. She has addressed this with her former PCP,she is not sure about Dx. She has not identified exacerbating or alleviating factors. OTC Gold cream.  Not associated with new detergent,soap,or body product.   Review of Systems  Constitutional: Positive for fatigue (no more than usual). Negative for appetite change and fever.  HENT: Positive for postnasal drip, rhinorrhea and sinus pressure. Negative for ear discharge, ear pain, facial swelling, mouth sores, sore throat, trouble swallowing and voice change.   Eyes: Negative for pain and visual disturbance.  Respiratory: Negative for cough, shortness of breath and wheezing.   Cardiovascular: Negative for chest pain, palpitations and leg swelling.  Gastrointestinal: Negative for abdominal pain and vomiting.       No changes in bowel habits.  Endocrine: Negative for cold intolerance and heat intolerance.  Genitourinary: Negative for decreased urine volume and hematuria.  Musculoskeletal: Positive for arthralgias and neck pain. Negative for gait problem and joint swelling.  Skin: Positive for rash. Negative for color change.  Allergic/Immunologic: Positive for environmental allergies.  Neurological: Positive for numbness and headaches (Occasional frontal pressure). Negative for syncope.  Psychiatric/Behavioral: Negative for confusion. The patient is nervous/anxious.  Current Outpatient Prescriptions on File Prior to Visit  Medication Sig Dispense Refill  . acetaminophen (EXTRA STRENGTH PAIN  RELIEF) 500 MG tablet Take 500 mg by mouth 3 (three) times daily with meals.     . B Complex-C (SUPER B COMPLEX PO) Take 1 tablet by mouth daily.    . Biotin (BIOTIN 5000) 5 MG CAPS Take 1 capsule by mouth 2 (two) times daily with breakfast and lunch.     . Calcium Carbonate-Vitamin D (CALCIUM + D) 600-200 MG-UNIT TABS Take 1 tablet by mouth 2 (two) times daily with breakfast and lunch.     . cholecalciferol (VITAMIN D) 1000 units tablet Take 1,000 Units by mouth 2 (two) times daily.    . Coenzyme Q10 (COQ10 PO) Take 1 tablet by mouth daily with breakfast.     . CRANBERRY PO Take 4,200 mg by mouth 2 (two) times daily.     . Cyanocobalamin (VITAMIN B-12) 2500 MCG SUBL 1 tablet daily.     . diclofenac sodium (VOLTAREN) 1 % GEL Apply 2-4 g topically 4 (four) times daily as needed (for pain on shoulders and knees).    . ferrous sulfate 325 (65 FE) MG tablet Take 325 mg by mouth daily with breakfast.     . glucosamine-chondroitin 500-400 MG tablet Take 1 tablet by mouth 2 (two) times daily at 8 am and 10 pm.     . Lactobacillus (ACIDOPHILUS) CAPS capsule Take 1 capsule by mouth 3 (three) times daily.     . montelukast (SINGULAIR) 10 MG tablet Take 1 tablet (10 mg total) by mouth at bedtime. 90 tablet 3  . Nutritional Supplements (JUICE PLUS FIBRE PO) Take 3 capsules by mouth 2 (two) times daily.    . Omega-3 Fatty Acids (FISH OIL) 1200 MG CAPS Take 1 capsule by mouth daily.    Marland Kitchen omeprazole (PRILOSEC OTC) 20 MG tablet Take 20 mg by mouth daily with breakfast.    . pyridOXINE (VITAMIN B-6) 100 MG tablet Take 100 mg by mouth 2 (two) times daily.      No current facility-administered medications on file prior to visit.      Past Medical History:  Diagnosis Date  . Arthritis    HX of chronic pain from Arthritis  . Cancer of skin    scalp  . DVT, lower extremity (HCC)    Allergies  Allergen Reactions  . Percocet [Oxycodone-Acetaminophen] Nausea And Vomiting  . Cefuroxime Axetil   . Diflunisal      Give by dnetist had a bad reaction  . Levofloxacin     To many side effects and did not take  . Lexapro [Escitalopram Oxalate]     Patient states she had a "bad reaction"  . Nystatin     Just states she can't take this  . Penicillins     Childhood reaction  Has patient had a PCN reaction causing immediate rash, facial/tongue/throat swelling, SOB or lightheadedness with hypotension: Unknown Has patient had a PCN reaction causing severe rash involving mucus membranes or skin necrosis: Unknown Has patient had a PCN reaction that required hospitalization: Unknown Has patient had a PCN reaction occurring within the last 10 years: Unknown If all of the above answers are "NO", then may proceed with Cephalosporin use.   . Azelastine-Fluticasone Rash    Social History   Social History  . Marital status: Widowed    Spouse name: N/A  . Number of children: N/A  . Years of education: N/A  Social History Main Topics  . Smoking status: Never Smoker  . Smokeless tobacco: Never Used  . Alcohol use No  . Drug use: No  . Sexual activity: Not Asked   Other Topics Concern  . None   Social History Narrative  . None    Vitals:   05/24/16 1522  BP: 118/62  Pulse: 88  Resp: 12  O2 sat 98% at RA.  Body mass index is 25.24 kg/m.   Physical Exam  Nursing note and vitals reviewed. Constitutional: She is oriented to person, place, and time. She appears well-developed and well-nourished. She does not appear ill. No distress.  HENT:  Head: Atraumatic.  Right Ear: Tympanic membrane, external ear and ear canal normal.  Left Ear: Tympanic membrane, external ear and ear canal normal.  Mouth/Throat: Oropharynx is clear and moist and mucous membranes are normal.  Eyes: Conjunctivae and EOM are normal.  Cardiovascular: Normal rate and regular rhythm.   Murmur (soft SEM RUSB) heard. Pulses:      Dorsalis pedis pulses are 2+ on the right side, and 2+ on the left side.  Respiratory: Effort  normal and breath sounds normal. No respiratory distress.  GI: Soft. She exhibits no mass. There is no hepatomegaly. There is no tenderness.  Musculoskeletal: She exhibits no edema.       Cervical back: She exhibits decreased range of motion. She exhibits no tenderness and no bony tenderness.  Shoulder with no significant limitation ROM,pain elicited. Right wrist pain elicited with ROM, not limitation. Tinel negative bilateral, Phalen positive right hand. No signs of synovitis.  Lymphadenopathy:    She has no cervical adenopathy.  Neurological: She is alert and oriented to person, place, and time. She has normal strength.  Skin: Skin is warm. Rash noted. No ecchymosis noted. Rash is maculopapular. Rash is not vesicular.     Maculopapular,erythematous ,confluent rash on left side (waist) and mid back.No tender.  Psychiatric: Her mood appears anxious.  Fairly groomed, good eye contact.      ASSESSMENT AND PLAN:   Jaylan was seen today for follow-up.  Diagnoses and all orders for this visit:  Gastroesophageal reflux disease, esophagitis presence not specified  GERD precautions discussed. Some side effects of PPI discussed. Decreased Prilosec to once daily.  Allergic rhinitis, unspecified chronicity, unspecified seasonality, unspecified trigger  She can take Mucinex (plain) daily as needed. She could resume Allegra if symptoms get worse. Nasal irrigations with saline recommended. OTC intranasal steroids may also help.  Heart palpitations  Resolved. F/U as needed.  Right carpal tunnel syndrome  Right wrist splint recommended to wear at night. She prefers to hold on EMG. Instructed about warning signs. F/U in 4 months.  Tingling of right upper extremity  ? Radicular cervical pain. Instructed about warning signs. She may need cervical MRI. Recommend following with ortho if worse.   Generalized osteoarthritis of multiple sites  Continue Tylenol. OTC Fish oil may  also help. Tai Chi or yoga may help also.  Pruritic erythematous rash  ? Eczema. OTC Hydrocortisone 1% bid as needed may help. Allegra to resume if not better with topical medication. Daily moisturizer. F/U as needed.       Betty G. Martinique, MD  [employer] [place] office.

## 2016-05-24 ENCOUNTER — Encounter: Payer: Self-pay | Admitting: Family Medicine

## 2016-05-24 ENCOUNTER — Ambulatory Visit (INDEPENDENT_AMBULATORY_CARE_PROVIDER_SITE_OTHER): Payer: PPO | Admitting: Family Medicine

## 2016-05-24 VITALS — BP 118/62 | HR 88 | Resp 12 | Ht 60.0 in | Wt 129.2 lb

## 2016-05-24 DIAGNOSIS — G5601 Carpal tunnel syndrome, right upper limb: Secondary | ICD-10-CM

## 2016-05-24 DIAGNOSIS — K219 Gastro-esophageal reflux disease without esophagitis: Secondary | ICD-10-CM | POA: Diagnosis not present

## 2016-05-24 DIAGNOSIS — R002 Palpitations: Secondary | ICD-10-CM | POA: Diagnosis not present

## 2016-05-24 DIAGNOSIS — R202 Paresthesia of skin: Secondary | ICD-10-CM | POA: Diagnosis not present

## 2016-05-24 DIAGNOSIS — M159 Polyosteoarthritis, unspecified: Secondary | ICD-10-CM | POA: Diagnosis not present

## 2016-05-24 DIAGNOSIS — L298 Other pruritus: Secondary | ICD-10-CM | POA: Diagnosis not present

## 2016-05-24 DIAGNOSIS — J309 Allergic rhinitis, unspecified: Secondary | ICD-10-CM

## 2016-05-24 NOTE — Patient Instructions (Addendum)
A few things to remember from today's visit:   Gastroesophageal reflux disease, esophagitis presence not specified  Allergic rhinitis, unspecified chronicity, unspecified seasonality, unspecified trigger  Heart palpitations  Right carpal tunnel syndrome  Tingling of right upper extremity Continue Mucinex plain.  Resume Singulair 10 mg at night if needed.   ? Cervical radicular pain. We may need nerve conduction test or neck MRI.  Decreased Prilosec to once daily.   Please let me know in 4 weeks if you still have tingling on right arm.  Carpal Tunnel Syndrome Carpal tunnel syndrome is a condition that causes pain in your hand and arm. The carpal tunnel is a narrow area located on the palm side of your wrist. Repeated wrist motion or certain diseases may cause swelling within the tunnel. This swelling pinches the main nerve in the wrist (median nerve). What are the causes? This condition may be caused by:  Repeated wrist motions.  Wrist injuries.  Arthritis.  A cyst or tumor in the carpal tunnel.  Fluid buildup during pregnancy. Sometimes the cause of this condition is not known. What increases the risk? This condition is more likely to develop in:  People who have jobs that cause them to repeatedly move their wrists in the same motion, such as Art gallery manager.  Women.  People with certain conditions, such as:  Diabetes.  Obesity.  An underactive thyroid (hypothyroidism).  Kidney failure. What are the signs or symptoms? Symptoms of this condition include:  A tingling feeling in your fingers, especially in your thumb, index, and middle fingers.  Tingling or numbness in your hand.  An aching feeling in your entire arm, especially when your wrist and elbow are bent for long periods of time.  Wrist pain that goes up your arm to your shoulder.  Pain that goes down into your palm or fingers.  A weak feeling in your hands. You may have trouble  grabbing and holding items. Your symptoms may feel worse during the night. How is this diagnosed? This condition is diagnosed with a medical history and physical exam. You may also have tests, including:  An electromyogram (EMG). This test measures electrical signals sent by your nerves into the muscles.  X-rays. How is this treated? Treatment for this condition includes:  Lifestyle changes. It is important to stop doing or modify the activity that caused your condition.  Physical or occupational therapy.  Medicines for pain and inflammation. This may include medicine that is injected into your wrist.  A wrist splint.  Surgery. Follow these instructions at home: If you have a splint:   Wear it as told by your health care provider. Remove it only as told by your health care provider.  Loosen the splint if your fingers become numb and tingle, or if they turn cold and blue.  Keep the splint clean and dry. General instructions   Take over-the-counter and prescription medicines only as told by your health care provider.  Rest your wrist from any activity that may be causing your pain. If your condition is work related, talk to your employer about changes that can be made, such as getting a wrist pad to use while typing.  If directed, apply ice to the painful area:  Put ice in a plastic bag.  Place a towel between your skin and the bag.  Leave the ice on for 20 minutes, 2-3 times per day.  Keep all follow-up visits as told by your health care provider. This is important.  Do any exercises as told by your health care provider, physical therapist, or occupational therapist. Contact a health care provider if:  You have new symptoms.  Your pain is not controlled with medicines.  Your symptoms get worse. This information is not intended to replace advice given to you by your health care provider. Make sure you discuss any questions you have with your health care  provider. Document Released: 02/06/2000 Document Revised: 06/19/2015 Document Reviewed: 06/26/2014 Elsevier Interactive Patient Education  2017 [place].  Please be sure medication list is accurate. If a new problem present, please set up appointment sooner than planned today.

## 2016-05-24 NOTE — Progress Notes (Signed)
Pre visit review using our clinic review tool, if applicable. No additional management support is needed unless otherwise documented below in the visit note.    Blood Type: A+

## 2016-05-26 ENCOUNTER — Telehealth: Payer: Self-pay | Admitting: Family Medicine

## 2016-05-26 NOTE — Telephone Encounter (Signed)
Pt was seen on 05/24/16 and was told to get splint for her carpal tunnel for right hand. Pt would like a splint for left hand also. Pt needs a letter that its medical necessity for her to get splints for insurance purposes. Pt will get splints from [facility] at [facility] [address]

## 2016-05-27 DIAGNOSIS — G5601 Carpal tunnel syndrome, right upper limb: Secondary | ICD-10-CM | POA: Diagnosis not present

## 2016-05-27 NOTE — Telephone Encounter (Signed)
Pt calling to see if the letter could be faxed to her living facility due to her hand issue she prefer not to drive pt would like to have it for both hands.  [facility]  [place] Attn: Nancy Nordmann

## 2016-05-27 NOTE — Telephone Encounter (Signed)
It is Ok to provide letter for right carpal tunnel syndrome treatment with wrist splint. I cannot do so for left wrist because Dx was right carpal tunnel synd based on Hx and examination.  Thanks, BJ

## 2016-05-27 NOTE — Telephone Encounter (Signed)
Letter okay

## 2016-05-28 NOTE — Telephone Encounter (Signed)
Letter written & faxed.  Message sent to patient about message below.   Right carpal tunnel was the only one diagnosed during the visit, so we can't provide a note for the left one.

## 2016-06-01 ENCOUNTER — Encounter: Payer: Self-pay | Admitting: Family Medicine

## 2016-06-01 ENCOUNTER — Ambulatory Visit (INDEPENDENT_AMBULATORY_CARE_PROVIDER_SITE_OTHER): Payer: PPO | Admitting: Family Medicine

## 2016-06-01 VITALS — BP 122/70 | HR 83 | Temp 97.6°F | Resp 12 | Ht 60.0 in | Wt 130.4 lb

## 2016-06-01 DIAGNOSIS — J358 Other chronic diseases of tonsils and adenoids: Secondary | ICD-10-CM | POA: Diagnosis not present

## 2016-06-01 DIAGNOSIS — L659 Nonscarring hair loss, unspecified: Secondary | ICD-10-CM | POA: Insufficient documentation

## 2016-06-01 DIAGNOSIS — J029 Acute pharyngitis, unspecified: Secondary | ICD-10-CM | POA: Diagnosis not present

## 2016-06-01 DIAGNOSIS — J309 Allergic rhinitis, unspecified: Secondary | ICD-10-CM

## 2016-06-01 DIAGNOSIS — M159 Polyosteoarthritis, unspecified: Secondary | ICD-10-CM | POA: Diagnosis not present

## 2016-06-01 NOTE — Progress Notes (Signed)
Pre visit review using our clinic review tool, if applicable. No additional management support is needed unless otherwise documented below in the visit note. 

## 2016-06-01 NOTE — Patient Instructions (Signed)
A few things to remember from today's visit:   Allergic rhinitis, unspecified chronicity, unspecified seasonality, unspecified trigger  Alopecia of scalp  Generalized osteoarthritis of multiple sites  Sore throat  Tonsil stone  Tonsil stone in right tonsil,benign,this can cause some discomfort.  Shoulder pain and upper back pain could be your arthritis.   Symptomatic treatment: Over the counter Acetaminophen 500 mg and/or Ibuprofen (400-600 mg) if there is not contraindications; you can alternate in between both every 4-6 hours. Gargles with saline water and throat lozenges might also help. Cold fluids.    Seek prompt medical evaluation if you are having difficulty breathing, mouth swelling, throat closing up, not able to swallow liquids (drooling), skin rash/bruising, or worsening symptoms.  Rogaine might help with hair loss.    Please be sure medication list is accurate. If a new problem present, please set up appointment sooner than planned today.

## 2016-06-01 NOTE — Progress Notes (Signed)
HPI:   ACUTE VISIT:  Chief Complaint  Patient presents with  . back of throat is sore    [name] is a 66 y.o. female, who is here today complaining of 3 days of sore throat. Problem started 3 days ago,intermittent, exacerbated by swallowing water . Describes pain like when she swallows a pill and scratches her throat. She tried to reach area of discomfort and felt like it was swollen. She denies chills or fever. No cough and no changes in fatigue or arthralgias. Hx of allergic rhinitis, no changes in rhinorrhea,post nasal drainage, or nasal congestion. Last OV (05/24/16) I recommended resuming Singulair and/or Allegra if symptoms continue,She is on Mucinex.  She denies stridor or dysphagia.  She has not tried OTC treatment.   -She is also c/o bilateral shoulder pain and upper back pain.These symptoms are not new,she has Hx of OA, she feels like Tylenol is not longer helping. Involved joints: Cervical,shoulders, knees,feet,and lower back. She follows with ortho as needed. She is also on Voltaren gel. She denies falls or injuries recently. No limitation of ROM but it elicits pain. Pain alleviated by rest.  -Hair loss:  She states that she has Hx of alopecia. She has followed with dermatologists. She has 2 "pre-cancer" lesions on scalp and she has noted some hair growing back on these area but parietal hair loss has not improved. She wears a wig during summer and a hat during cold weather. Mother with Hx of hair loss.  She has no noted alopecia on other area.   Review of Systems  Constitutional: Positive for fatigue. Negative for activity change, appetite change and fever.  HENT: Positive for postnasal drip and sore throat. Negative for ear pain, mouth sores, sinus pressure, sneezing, trouble swallowing and voice change.   Eyes: Negative for discharge and redness.  Respiratory: Negative for cough, shortness of breath and wheezing.   Cardiovascular:  Negative for chest pain.  Gastrointestinal: Negative for abdominal pain and vomiting.       No changes in bowel habits.  Genitourinary: Negative for decreased urine volume and hematuria.  Musculoskeletal: Positive for arthralgias, back pain and neck pain. Negative for joint swelling.  Skin: Negative for color change and rash.  Allergic/Immunologic: Positive for environmental allergies.  Neurological: Negative for syncope, weakness and headaches.  Hematological: Negative for adenopathy. Does not bruise/bleed easily.  Psychiatric/Behavioral: Negative for confusion. The patient is nervous/anxious.       Current Outpatient Prescriptions on File Prior to Visit  Medication Sig Dispense Refill  . acetaminophen (EXTRA STRENGTH PAIN RELIEF) 500 MG tablet Take 500 mg by mouth 3 (three) times daily with meals.     . B Complex-C (SUPER B COMPLEX PO) Take 1 tablet by mouth daily.    . Biotin (BIOTIN 5000) 5 MG CAPS Take 1 capsule by mouth 2 (two) times daily with breakfast and lunch.     . Calcium Carbonate-Vitamin D (CALCIUM + D) 600-200 MG-UNIT TABS Take 1 tablet by mouth 2 (two) times daily with breakfast and lunch.     . cholecalciferol (VITAMIN D) 1000 units tablet Take 1,000 Units by mouth 2 (two) times daily.    . Coenzyme Q10 (COQ10 PO) Take 1 tablet by mouth daily with breakfast.     . CRANBERRY PO Take 4,200 mg by mouth 2 (two) times daily.     . diclofenac sodium (VOLTAREN) 1 % GEL Apply 2-4 g topically 4 (four) times daily as needed (for pain on  shoulders and knees).    . ferrous sulfate 325 (65 FE) MG tablet Take 325 mg by mouth daily with breakfast.     . glucosamine-chondroitin 500-400 MG tablet Take 1 tablet by mouth 2 (two) times daily at 8 am and 10 pm.     . Lactobacillus (ACIDOPHILUS) CAPS capsule Take 1 capsule by mouth 3 (three) times daily.     . Nutritional Supplements (JUICE PLUS FIBRE PO) Take 3 capsules by mouth 2 (two) times daily.    . Omega-3 Fatty Acids (FISH OIL) 1200  MG CAPS Take 1 capsule by mouth daily.    Marland Kitchen omeprazole (PRILOSEC OTC) 20 MG tablet Take 20 mg by mouth daily with breakfast.     No current facility-administered medications on file prior to visit.      Past Medical History:  Diagnosis Date  . Arthritis    HX of chronic pain from Arthritis  . Cancer of skin    scalp  . DVT, lower extremity (HCC)    Allergies  Allergen Reactions  . Percocet [Oxycodone-Acetaminophen] Nausea And Vomiting  . Cefuroxime Axetil   . Diflunisal     Give by dnetist had a bad reaction  . Levofloxacin     To many side effects and did not take  . Lexapro [Escitalopram Oxalate]     Patient states she had a "bad reaction"  . Nystatin     Just states she can't take this  . Penicillins     Childhood reaction  Has patient had a PCN reaction causing immediate rash, facial/tongue/throat swelling, SOB or lightheadedness with hypotension: Unknown Has patient had a PCN reaction causing severe rash involving mucus membranes or skin necrosis: Unknown Has patient had a PCN reaction that required hospitalization: Unknown Has patient had a PCN reaction occurring within the last 10 years: Unknown If all of the above answers are "NO", then may proceed with Cephalosporin use.   . Azelastine-Fluticasone Rash    Social History   Social History  . Marital status: Widowed    Spouse name: N/A  . Number of children: N/A  . Years of education: N/A   Social History Main Topics  . Smoking status: Never Smoker  . Smokeless tobacco: Never Used  . Alcohol use No  . Drug use: No  . Sexual activity: Not Asked   Other Topics Concern  . None   Social History Narrative  . None    Vitals:   06/01/16 1043  BP: 122/70  Pulse: 83  Resp: 12  Temp: 97.6 F (36.4 C)  O2 sat at RA 96% Body mass index is 25.46 kg/m.   Physical Exam  Nursing note and vitals reviewed. Constitutional: She is oriented to person, place, and time. She appears well-developed and  well-nourished. She does not appear ill. No distress.  HENT:  Head: Atraumatic.  Nose: Right sinus exhibits no maxillary sinus tenderness and no frontal sinus tenderness. Left sinus exhibits no maxillary sinus tenderness and no frontal sinus tenderness.  Mouth/Throat: Uvula is midline, oropharynx is clear and moist and mucous membranes are normal.  Right tonsil slightly hypertrophic with a tonsil stone.   Eyes: Conjunctivae and EOM are normal.  Cardiovascular: Normal rate and regular rhythm.   No murmur heard. Respiratory: Effort normal and breath sounds normal. No stridor. No respiratory distress.  Musculoskeletal: She exhibits no edema.  Pain elicited with active shoulder ROM. Cervical ROM limited, mild pain elicited. No signs of synovitis or significant deformities.  Lymphadenopathy:  She has no cervical adenopathy.  Neurological: She is alert and oriented to person, place, and time. She has normal strength. Gait normal.  Skin: Skin is warm. No rash noted. No erythema.  parietal alopecia, no scalp lesions appreciated.  Psychiatric: Her speech is normal. Her mood appears anxious.  Well groomed, good eye contact.      ASSESSMENT AND PLAN:   Tesa was seen today for back of throat is sore.  Diagnoses and all orders for this visit:  Alopecia of scalp  Chronic,she has seen dermatologist already. She could try Rogaine OTC. F/U with dermatology as needed.  Allergic rhinitis, unspecified chronicity, unspecified seasonality, unspecified trigger  Resume Singulair 10 mg daily. Nasal saline may also help.  Sore throat  Possible causes discussed,including allergies,GERD,viral among some. OTC throat lozenges or gargles with saline may help. Monitor for new symptoms.  This problem has been recurrent fr the past couple months and even though it seems to be mild it seems to aggravate anxiety. This along with post nasal drainage, nasal congestion, and ear discomfort have been  frequent concerns. ENT evaluation could be arranged but she prefers to hold on referral.  F/U as needed.  Generalized osteoarthritis of multiple sites  She can follow with ortho if she feels like pain is getting worse. We dicussed prognosis and treatment options for OA. Fall prevention. Low impact exercise: Tai Chi or aquatic exercises.  Tonsil stone  Reassured. Educated about problem. Avoid manipulation with finger because this can cause irritation. F/U as needed.     -Ms.Carmela Piechowski was advised to return or notify a doctor immediately if symptoms worsen or new concerns arise.       Betty G. Martinique, MD  [facility]. [place] office.

## 2016-06-15 ENCOUNTER — Telehealth: Payer: Self-pay | Admitting: Family Medicine

## 2016-06-15 NOTE — Telephone Encounter (Signed)
FYI

## 2016-06-15 NOTE — Telephone Encounter (Signed)
° °  FYI    She said her hands are ok it just depends on what she is doing.. She is going to try something that her mother ordered and she will try that and she how it works

## 2016-07-07 NOTE — Progress Notes (Signed)
HPI:   [name] is a 66 y.o. female, who is here today for 4 months f/u of visit on 03/09/16 (elevated BP), I have seen her 3 times since then.  She has Hx of GERD, generalized OA,alopecia,chronic nausea s/p cholecystectomy, and anxiety.  GERD: She is on Omeprazole 20 gm daily, started about 3 years ago by Dr [name]. She thinks it was started for burping, which now happens occasionally, she is not sure about exacerbating or alleviating factors.  She is not having heartburn. Nausea stable, developed after cholecystectomy.  Denies abdominal pain, vomiting, changes in bowel habits, blood in stool or melena.   OA She is on Tylenol 500 mg tid. She is having a "hard time" with arthralgias, it "hurts all over", "every where" She gets about 3-4 hours of sleep due to pain.  + Stiffness and pain worse in the morning when she first gets up, she has to use her cane in the morning. He has alleviated by movement and warm shower. Warm shower "loses it up" joints.  She has history of lumbar spine surgery, receive a right lower extremity neuropathic pain, states that he feels like blood is coming back in fourth throughout the extremity, denies numbness or tingling.  Neck,shoulder,knees,and lower back. "A catch" on medial aspect of left ankle, noted about 2 weeks ago. She completed PT for lower back pain and has a TENS unit.  Lower back pain radiates sometimes to lateral aspect of left hip, stretching exercises help.  She is not longer following with ortho.  Glucosamine and tart cherry help some but pain is still moderate to severe.   Concerns today:   Right hand tingling/numbness has improved with wearing wrist brace, still having moderate symptoms depending on frequency of  certain activities during the day. She is inquiring about steroid shot, states that if surgery is recommended she will not be able to do it.  She has milder symptoms on left wrist and splint has  helped greatly on this one.   Review of Systems  Constitutional: Positive for fatigue. Negative for activity change, appetite change, fever and unexpected weight change.  HENT: Positive for postnasal drip and sore throat. Negative for mouth sores, nosebleeds and trouble swallowing.   Respiratory: Negative for cough, shortness of breath and wheezing.   Cardiovascular: Negative for chest pain, palpitations and leg swelling.  Gastrointestinal: Negative for abdominal pain, nausea and vomiting.       Negative for changes in bowel habits.  Musculoskeletal: Positive for arthralgias, back pain, myalgias and neck pain.  Skin: Negative for pallor and rash.  Allergic/Immunologic: Positive for environmental allergies.  Neurological: Positive for numbness. Negative for syncope, weakness and headaches.  Psychiatric/Behavioral: Positive for sleep disturbance. Negative for confusion. The patient is nervous/anxious.      Current Outpatient Prescriptions on File Prior to Visit  Medication Sig Dispense Refill  . acetaminophen (EXTRA STRENGTH PAIN RELIEF) 500 MG tablet Take 500 mg by mouth 3 (three) times daily with meals.     . B Complex-C (SUPER B COMPLEX PO) Take 1 tablet by mouth daily.    . Biotin (BIOTIN 5000) 5 MG CAPS Take 1 capsule by mouth 2 (two) times daily with breakfast and lunch.     . Calcium Carbonate-Vitamin D (CALCIUM + D) 600-200 MG-UNIT TABS Take 1 tablet by mouth 2 (two) times daily with breakfast and lunch.     . cholecalciferol (VITAMIN D) 1000 units tablet Take 1,000 Units by mouth 2 (  two) times daily.    . Coenzyme Q10 (COQ10 PO) Take 1 tablet by mouth daily with breakfast.     . CRANBERRY PO Take 4,200 mg by mouth 2 (two) times daily.     . diclofenac sodium (VOLTAREN) 1 % GEL Apply 2-4 g topically 4 (four) times daily as needed (for pain on shoulders and knees).    . ferrous sulfate 325 (65 FE) MG tablet Take 325 mg by mouth daily with breakfast.     . glucosamine-chondroitin  500-400 MG tablet Take 1 tablet by mouth 2 (two) times daily at 8 am and 10 pm.     . guaiFENesin (MUCINEX) 600 MG 12 hr tablet Take 600 mg by mouth 2 (two) times daily.    . Lactobacillus (ACIDOPHILUS) CAPS capsule Take 1 capsule by mouth 3 (three) times daily.     Marland Kitchen MAGNESIUM PO Take by mouth. Take 1 tablet by mouth daily.    . Misc Natural Products (TART CHERRY ADVANCED PO) Take by mouth. Take 1 capsule a day.    . Nutritional Supplements (JUICE PLUS FIBRE PO) Take 3 capsules by mouth 2 (two) times daily.    . Omega-3 Fatty Acids (FISH OIL) 1200 MG CAPS Take 1 capsule by mouth daily.    Marland Kitchen omeprazole (PRILOSEC OTC) 20 MG tablet Take 20 mg by mouth daily with breakfast.     No current facility-administered medications on file prior to visit.      Past Medical History:  Diagnosis Date  . Arthritis    HX of chronic pain from Arthritis  . Cancer of skin    scalp  . DVT, lower extremity (HCC)    Allergies  Allergen Reactions  . Percocet [Oxycodone-Acetaminophen] Nausea And Vomiting  . Cefuroxime Axetil   . Diflunisal     Give by dnetist had a bad reaction  . Levofloxacin     To many side effects and did not take  . Lexapro [Escitalopram Oxalate]     Patient states she had a "bad reaction"  . Nystatin     Just states she can't take this  . Penicillins     Childhood reaction  Has patient had a PCN reaction causing immediate rash, facial/tongue/throat swelling, SOB or lightheadedness with hypotension: Unknown Has patient had a PCN reaction causing severe rash involving mucus membranes or skin necrosis: Unknown Has patient had a PCN reaction that required hospitalization: Unknown Has patient had a PCN reaction occurring within the last 10 years: Unknown If all of the above answers are "NO", then may proceed with Cephalosporin use.   . Azelastine-Fluticasone Rash    Social History   Social History  . Marital status: Widowed    Spouse name: N/A  . Number of children: N/A  .  Years of education: N/A   Social History Main Topics  . Smoking status: Never Smoker  . Smokeless tobacco: Never Used  . Alcohol use No  . Drug use: No  . Sexual activity: Not Asked   Other Topics Concern  . None   Social History Narrative  . None    Vitals:   07/08/16 1356  BP: 130/60  Pulse: 71  Resp: 12  O2 sat at RA 98%. Body mass index is 25.88 kg/m.   Physical Exam  Nursing note and vitals reviewed. Constitutional: She is oriented to person, place, and time. She appears well-developed and well-nourished. She does not appear ill. No distress.  HENT:  Head: Atraumatic.  Mouth/Throat: Uvula is midline,  oropharynx is clear and moist and mucous membranes are normal.     Eyes: Conjunctivae and EOM are normal.  Cardiovascular: Normal rate and regular rhythm.   No murmur heard. Pulses:      Dorsalis pedis pulses are 2+ on the right side, and 2+ on the left side.  Varicose veins, lower extremities bilateral.  Respiratory: Effort normal and breath sounds normal. No respiratory distress.  GI: Soft. She exhibits no mass. There is no hepatomegaly. There is no tenderness.  Musculoskeletal: She exhibits no edema.  Right rotator cuff maneuvers pos bilateral, R>L No deformity, edema, or erythema appreciated.No significant muscle atrophy. Luan Pulling' test pos, drop arm rotator cuff test pos, empty can supraspinatus test pos, lift-Off Subscapularis test pos. ROM moderate limited, R>L, pain is elicited.  Limitation cervical ROM.  Bilateral wrist splints.  Pain upon palpation of lumbar paraspinal muscles, left: L3-5. Hip and knee normal ROM, left pain elicited (mild).Crepitus knee bilateral. Right ankle decreased ROM. No signs of synovitis.   Lymphadenopathy:    She has no cervical adenopathy.  Neurological: She is alert and oriented to person, place, and time. She has normal strength. Gait normal.  SLR negative bilateral. She is able to walk on heels and tip toes.  Skin:  Skin is warm. No rash noted. No erythema.  Psychiatric: Her mood appears anxious. She expresses no suicidal ideation.  Poor groomed, good eye contact.     ASSESSMENT AND PLAN:  Ketty was seen today for follow-up.  Diagnoses and all orders for this visit:  Generalized osteoarthritis of multiple sites  We discussed Dx and prognosis. Treatment options: NSAID, Cymbalta among others. She agrees with trying Cymbalta. Will hold on NSAID's for now. Continue Tylenol. She could also try OTC Fish oil and Tumeric. We discussed side effects of medications. F/U in 3-4 weeks.  -     DULoxetine (CYMBALTA) 20 MG capsule; Take 1 capsule (20 mg total) by mouth daily.  Gastroesophageal reflux disease, esophagitis presence not specified  Stable. No changes in current management for now. Some side effects of PPI's discussed.  Right carpal tunnel syndrome  She is interested in steroid injection, which I am not trained to do,so referral to sport mediciine will be arranged. Continue wearing wrist splints,mainly at night.  -     Ambulatory referral to Sports Medicine  GAD (generalized anxiety disorder)  Instructed about warning signs. Cymbalta may help. F/U in 3 weeks.  -     DULoxetine (CYMBALTA) 20 MG capsule; Take 1 capsule (20 mg total) by mouth daily.     -Ms. [name] was advised to return sooner than planned today if new concerns arise.       Areeb Corron G. Martinique, MD  [facility]. [place] office.

## 2016-07-08 ENCOUNTER — Encounter: Payer: Self-pay | Admitting: Family Medicine

## 2016-07-08 ENCOUNTER — Ambulatory Visit (INDEPENDENT_AMBULATORY_CARE_PROVIDER_SITE_OTHER): Payer: PPO | Admitting: Family Medicine

## 2016-07-08 VITALS — BP 130/60 | HR 71 | Resp 12 | Ht 60.0 in | Wt 132.5 lb

## 2016-07-08 DIAGNOSIS — M159 Polyosteoarthritis, unspecified: Secondary | ICD-10-CM

## 2016-07-08 DIAGNOSIS — F411 Generalized anxiety disorder: Secondary | ICD-10-CM | POA: Diagnosis not present

## 2016-07-08 DIAGNOSIS — K219 Gastro-esophageal reflux disease without esophagitis: Secondary | ICD-10-CM | POA: Diagnosis not present

## 2016-07-08 DIAGNOSIS — G5601 Carpal tunnel syndrome, right upper limb: Secondary | ICD-10-CM | POA: Diagnosis not present

## 2016-07-08 MED ORDER — DULOXETINE HCL 20 MG PO CPEP: 20.0000 mg | ORAL_CAPSULE | Freq: Every day | ORAL | 1 refills | Status: DC

## 2016-07-08 NOTE — Patient Instructions (Addendum)
A few things to remember from today's visit:   Generalized osteoarthritis of multiple sites - Plan: DULoxetine (CYMBALTA) 20 MG capsule  Gastroesophageal reflux disease, esophagitis presence not specified  Right carpal tunnel syndrome - Plan: Ambulatory referral to Sports Medicine  Today we started Cymbalta, this type of medications can increase suicidal risk. This is more prevalent among children,adolecents, and young adults with major depression or other psychiatric disorders. It can also make depression worse. Most common side effects are gastrointestinal, self limited after a few weeks: diarrhea, nausea, constipation  Or diarrhea among some.  In general it is well tolerated. We will follow closely.   Osteoarthritis is a chronic condition and gets worse with age.  The following may help:  Over the counter topical medications: Icy Hot or Asper cream with Lidocaine. Tai Chi or PT. Fall prevention. Avoid weight gain. Fish oil, over the counter Megared for example, 2 capsules daily. Tumeric twice daily.      Please be sure medication list is accurate. If a new problem present, please set up appointment sooner than planned today.

## 2016-07-13 ENCOUNTER — Telehealth: Payer: Self-pay | Admitting: Family Medicine

## 2016-07-13 NOTE — Telephone Encounter (Signed)
Left voicemail for patient to call the office back.   

## 2016-07-13 NOTE — Telephone Encounter (Signed)
Patient called back. We went over information below and patient verbalized understanding.

## 2016-07-13 NOTE — Telephone Encounter (Signed)
Please advise on the medications.

## 2016-07-13 NOTE — Telephone Encounter (Signed)
Pt state that she can not bring herself to take it due to the side effects (duloxetine and MOBIC) and she would like to try curaman which has tumeric extract and would this be okay to take?  [facility] has called and she will call again and want to have shots for both shoulders and both wrist and would like to know if this was what was included in the referral.

## 2016-07-13 NOTE — Telephone Encounter (Signed)
If she wants to try OTC Tumeric instead Cymbalta it is Ok to do so. [Please cancel f/u appt]. Thanks, BJ

## 2016-07-21 ENCOUNTER — Ambulatory Visit: Payer: Self-pay

## 2016-07-21 ENCOUNTER — Telehealth: Payer: Self-pay

## 2016-07-21 ENCOUNTER — Encounter: Payer: Self-pay | Admitting: Sports Medicine

## 2016-07-21 ENCOUNTER — Ambulatory Visit (INDEPENDENT_AMBULATORY_CARE_PROVIDER_SITE_OTHER): Payer: PPO | Admitting: Sports Medicine

## 2016-07-21 ENCOUNTER — Ambulatory Visit (INDEPENDENT_AMBULATORY_CARE_PROVIDER_SITE_OTHER): Payer: PPO

## 2016-07-21 VITALS — BP 130/84 | HR 81 | Ht 60.0 in | Wt 133.2 lb

## 2016-07-21 DIAGNOSIS — M4722 Other spondylosis with radiculopathy, cervical region: Secondary | ICD-10-CM

## 2016-07-21 DIAGNOSIS — M542 Cervicalgia: Secondary | ICD-10-CM

## 2016-07-21 DIAGNOSIS — M25532 Pain in left wrist: Secondary | ICD-10-CM

## 2016-07-21 DIAGNOSIS — M25531 Pain in right wrist: Secondary | ICD-10-CM

## 2016-07-21 MED ORDER — METHYLPREDNISOLONE 4 MG PO TBPK: ORAL_TABLET | ORAL | 0 refills | Status: DC

## 2016-07-21 MED ORDER — GABAPENTIN 100 MG PO CAPS: ORAL_CAPSULE | ORAL | 1 refills | Status: DC

## 2016-07-21 NOTE — Telephone Encounter (Signed)
Pt has been advised that any injections will be at the discretion of Dr. Paulla Fore based on his evaluation and treatment plan.   [facility] Night - Client Client Site [facility] - Night Physician Martinique, Betty - MD Contact Type Call Who Is Calling Patient / Member / Family / Caregiver Caller Name lether tesch Phone Number [phone] Patient Name [name] Call Type Message Only Information Provided Reason for Call Request for General Office Information Initial Comment wants dr to call in to her other dr she has appt with today to tell him to give her a shot for her wrist and shoulder pains. Additional Comment Caller Adds: if she can not answer the phone, leave a detailed msg. also, do not call the phone number on her chart, she will not be home.

## 2016-07-21 NOTE — Progress Notes (Signed)
OFFICE VISIT NOTE [name]. Courage Biglow, Nadine at [facility] [place] - 66 y.o. female MRN [id number]  Date of birth: [birth year]  Visit Date: 07/21/2016  PCP: Martinique, Betty G, MD   Referred by: Martinique, Betty G, MD  Burlene Arnt, CMA acting as scribe for Dr. Paulla Fore.  SUBJECTIVE:   Chief Complaint  Patient presents with  . pain in right wrist    carpal tunnel  . pain in right shoulder   HPI: As below and per problem based documentation when appropriate.  Pt presents today with complaint of right wrist and shoulder pain.  Pt reports that her PCP dx her with carpal tunnel of both wrist. She is interested in steroid injections today.  Pt reports that wrist pain started about 2 months ago. 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 She also does crocheting. Pt lives close to her mom so she also does a lot to help her.  Pain reports that when she is typing and crocheting she feels more pain in her palm. She feels more pain in the wrist when she is twisting the wrists. Pt has tried "Carpal Aid" which is a little plastic piece that you hold in your hand at night and it is supposed to alleviate the pain, she didn't get any relief with that.   Pt c/o bilateral shoulder pain  Pain reports that pain is constant for the past year or so. The pain varies depending on the weather.  Pt was helping her friend with her grocery cart and after that she started having trouble with her shoulders. She doesn't know if its related but does correlate the event with the timing the pain started. Pt reports that she has not have recent xray's of her shoulders but she was dx with osteoarthritis.   The pain is described as aching pain with an occasional pinching sensation.  Pain is rated as 10+/10 at its worst and 4/10 on average .The left shoulder seems to be worse than the right.   Worsened with getting out of bed in the morning.  The pain is most intense before she is able to take her medications.  Improves with Turmeric and Tart Cherry help with the pain. She hasn't been taking them very long. Pt is also taking glucosamine chondroitin. Therapies tried include : warm showers, holding the shower wand over her shoulders.   Other associated symptoms include: Pt reports neck and back pain. She also has some pain into her arms.   Pt. Denies fever, chills, night sweats, unintentional weight loss or weight gain.     Review of Systems  Constitutional: Negative for chills and fever.  Respiratory: Negative for shortness of breath and wheezing.   Cardiovascular: Positive for palpitations (after recieving steroid injection and using Rhinocort) and leg swelling (swelling in the right leg). Negative for chest pain.  Musculoskeletal: Positive for joint pain. Negative for falls.  Neurological: Positive for tingling (in fingers). Negative for dizziness and headaches.  Endo/Heme/Allergies: Does not bruise/bleed easily.    Otherwise per HPI.  HISTORY & PERTINENT PRIOR DATA:  No specialty comments available. She reports that she has never smoked. She has never used smokeless tobacco. No results for input(s): HGBA1C, LABURIC in the last 8760 hours. Medications & Allergies reviewed per EMR Patient Active Problem List   Diagnosis Date Noted  . Bilateral wrist pain 07/21/2016  . Neck pain 07/21/2016  . Osteoarthritis of spine with  radiculopathy, cervical region 07/21/2016  . GAD (generalized anxiety disorder) 07/08/2016  . Alopecia of scalp 06/01/2016  . Right carpal tunnel syndrome 05/24/2016  . Vitamin D deficiency 03/09/2016  . Anemia, iron deficiency 03/09/2016  . Allergic rhinitis 03/09/2016  . Generalized osteoarthritis of multiple sites 03/09/2016  . GERD (gastroesophageal reflux disease) 03/09/2016   Past Medical History:  Diagnosis Date  . Arthritis    HX of chronic pain from Arthritis  . Cancer of skin    scalp  .  DVT, lower extremity (HCC)    Family History  Problem Relation Age of Onset  . Pulmonary fibrosis Father   . COPD Mother    Past Surgical History:  Procedure Laterality Date  . ABDOMINAL HYSTERECTOMY    . BACK SURGERY     disk surgery  . CHOLECYSTECTOMY    . SKIN BIOPSY     Social History   Occupational History  . Not on file.   Social History Main Topics  . Smoking status: Never Smoker  . Smokeless tobacco: Never Used  . Alcohol use No  . Drug use: No  . Sexual activity: Not on file    OBJECTIVE:  VS:  HT:5' (152.4 cm)   WT:133 lb 3.2 oz (60.4 kg)  BMI:26.1    BP:130/84  HR:81bpm  TEMP: ( )  RESP:98 % EXAM: Findings:  WDWN, NAD, Non-toxic appearing Alert & appropriately interactive Not depressed or anxious appearing No increased work of breathing. Pupils are equal. EOM intact without nystagmus No clubbing or cyanosis of the extremities appreciated No significant rashes/lesions/ulcerations overlying the examined area. Radial pulses 2+/4.  No significant generalized UE edema. Sensation intact to light touch in upper extremities.  Neck & Shoulders: Well aligned, no significant torticollis No significant midline tenderness.   Mild TTP over the paraspinal musculature and rhomboids. Cervical ROM:       Flexion: 70      Extension: 50      Right   - Rotation: 60 Sidebending: 20       Left     - Rotation: 70 Sidebending: 25  NEURAL TENSION SIGNS Right       Brachial Plexus Squeeze: Mildly tender       Arm Squeeze Test: Mildly tender      Spurling's Compression Test:  Ipsilateral -painful without radiation             Left       Brachial Plexus Squeeze: Mildly tender       Arm Squeeze Test: Mildly tender       Spurling's Compression Test:  Ipsilateral -painful without radiation             Lhermitte's Compression test:  Negative/ No radiation   REFLEXES                           Right                         Left DTR - C5 -Biceps               2+/4                        2+/4 DTR - C6 - Brachiorad  1+/4                       1+/4 DTR - C7 -  Triceps              1+/4                       1+/4 UMN - Hoffman's Negative/Normal Negative/Normal  MOTOR TESTING: Intact in all UE myotomes  ++++++++++++++++++++++++++++++++++++++++++++++++++++++++++++++++++ LIMITED MSK ULTRASOUND OF bilateral upper extremities Images were obtained and interpreted by myself, Teresa Coombs, DO  Images have been saved and stored to PACS system. Images obtained on: GE S7 Ultrasound machine  FINDINGS:  Right median nerve: 0.9 cm Left median nerve: 0.9 cm  IMPRESSION:  No evidence of median nerve compression at the wrist.      Dg Cervical Spine 2 Or 3 Views  Result Date: 07/21/2016 CLINICAL DATA:  Neck pain radiating into both upper extremities for the past year. No known injury. EXAM: CERVICAL SPINE - 2-3 VIEW COMPARISON:  None in PACs FINDINGS: There is loss of the normal cervical lordosis. The vertebral bodies are preserved in height. The disc space heights are reasonably well-maintained though there is minimal narrowing at C5-6. There is no perched facet. The spinous processes are intact. The odontoid is intact where visualized. The prevertebral soft tissue spaces are normal. IMPRESSION: Mild degenerative disc space height loss at C5-6. Loss of the normal cervical lordosis may reflect muscle spasm. Given the upper extremity radicular symptoms, cervical spine MRI would be a useful next imaging step. Electronically Signed   By: David  Martinique M.D.   On: 07/21/2016 16:10   ASSESSMENT & PLAN:   Problem List Items Addressed This Visit    Bilateral wrist pain - Primary   Relevant Orders   [place] LIMITED JOINT SPACE STRUCTURES UP BILAT(NO LINKED CHARGES)   Neck pain   Relevant Orders   DG Cervical Spine 2 or 3 views (Completed)   Osteoarthritis of spine with radiculopathy, cervical region    Patient has bilateral upper extremity symptoms with normal median nerves  which effectively rule out carpal tunnel syndrome. I suspect is coming more from the radiculitis secondary to the osteoarthritis in her neck. Begin spine conditioning program from the [facility].  Medrol Dosepak and gabapentin with slow titration given her reported sensitivities to medications in general.  If any lack of improvement consider further diagnostic evaluation with MRI of the cervical spine.      Relevant Medications   TURMERIC PO   gabapentin (NEURONTIN) 100 MG capsule   methylPREDNISolone (MEDROL DOSEPAK) 4 MG TBPK tablet      Follow-up: Return in about 8 weeks (around 09/15/2016).   CMA/ATC served as Education administrator during this visit. History, Physical, and Plan performed by medical provider. Documentation and orders reviewed and attested to.      Teresa Coombs, [place] [facility] Physician

## 2016-07-26 NOTE — Assessment & Plan Note (Signed)
Patient has bilateral upper extremity symptoms with normal median nerves which effectively rule out carpal tunnel syndrome. I suspect is coming more from the radiculitis secondary to the osteoarthritis in her neck. Begin spine conditioning program from the [facility].  Medrol Dosepak and gabapentin with slow titration given her reported sensitivities to medications in general.  If any lack of improvement consider further diagnostic evaluation with MRI of the cervical spine.

## 2016-07-29 ENCOUNTER — Telehealth: Payer: Self-pay | Admitting: Family Medicine

## 2016-07-29 ENCOUNTER — Other Ambulatory Visit: Payer: Self-pay

## 2016-07-29 NOTE — Telephone Encounter (Signed)
Called pt and advised. She does not wish to try Lyrica at this time, she will just d/c Gabapentin.

## 2016-07-29 NOTE — Telephone Encounter (Signed)
Dr. Jordan - FYI 

## 2016-07-29 NOTE — Telephone Encounter (Signed)
FYI pt cancelled her appt for tomorrow. Pt would like dr Martinique to know she does not have carpal tunnel in wrist nor osteoarthritis

## 2016-07-29 NOTE — Telephone Encounter (Signed)
Patient states that she is having low grade nausea and "swimming" head from the gabapentin. She would like to d/c it. Please advise on course of action. Verified cell #

## 2016-07-29 NOTE — Telephone Encounter (Signed)
Okay to DC if she would like.  We can consider Lyrica if she would like to try something else but I am also okay with her coming off in general at this time

## 2016-07-29 NOTE — Telephone Encounter (Signed)
OK for pt to d/c Gabapentin?

## 2016-07-30 ENCOUNTER — Ambulatory Visit: Payer: PPO | Admitting: Family Medicine

## 2016-08-09 ENCOUNTER — Telehealth: Payer: Self-pay | Admitting: Family Medicine

## 2016-08-09 ENCOUNTER — Encounter: Payer: Self-pay | Admitting: Family Medicine

## 2016-08-09 NOTE — Telephone Encounter (Signed)
° ° °  Pt call to say sh was summon for jury duty and is asking if Dr Martinique will write a note excusing her from jury duty. BELOW ARE HER REASONS   PTSD  2 DISC IN HER NECK    Would like a call back  [phone]

## 2016-08-09 NOTE — Telephone Encounter (Signed)
Please advise 

## 2016-08-09 NOTE — Telephone Encounter (Signed)
It is Ok to provide requested letter. Hx of chronic back pain, prolonged sitting will exacerbate pain. Also Hx of anxiety.  Thanks, BJ

## 2016-08-10 NOTE — Telephone Encounter (Signed)
Letter has been written & placed on MD's desk for review.

## 2016-08-10 NOTE — Telephone Encounter (Signed)
Pt states the instructions also require documention form the dr about her disabilities.  So pt is wondering if she will need that along with the letter.

## 2016-08-11 NOTE — Telephone Encounter (Signed)
Please advise on note below. I have already written the letter for her jury duty.

## 2016-08-12 NOTE — Telephone Encounter (Signed)
Pt states she spoke with the court house rep and they said all they need is the doctor letter, no other documentation is needed. Will wait on on your call to pick up.

## 2016-08-13 ENCOUNTER — Encounter: Payer: Self-pay | Admitting: Family Medicine

## 2016-08-13 NOTE — Telephone Encounter (Signed)
Letter has been placed up front & message sent to patient.

## 2016-08-23 ENCOUNTER — Encounter: Payer: Self-pay | Admitting: Sports Medicine

## 2016-08-23 ENCOUNTER — Ambulatory Visit (INDEPENDENT_AMBULATORY_CARE_PROVIDER_SITE_OTHER): Payer: PPO | Admitting: Sports Medicine

## 2016-08-23 VITALS — BP 140/80 | HR 86 | Ht 60.0 in | Wt 131.0 lb

## 2016-08-23 DIAGNOSIS — M4722 Other spondylosis with radiculopathy, cervical region: Secondary | ICD-10-CM | POA: Diagnosis not present

## 2016-08-23 MED ORDER — AMITRIPTYLINE HCL 25 MG PO TABS: ORAL_TABLET | ORAL | 1 refills | Status: DC

## 2016-08-23 NOTE — Progress Notes (Signed)
OFFICE VISIT NOTE Juanda Bond. [place], [place] at [facility] [phone]  Tkeyah Burkman - 66 y.o. female MRN [id number]  Date of birth: 1950-04-27  Visit Date: 08/23/2016  PCP: Martinique, Betty G, MD   Referred by: Martinique, Betty G, MD  Jari Sportsman, cma acting as scribe for Dr. Paulla Fore.  SUBJECTIVE:   Chief Complaint  Patient presents with  . Follow-up  . Cervical Osteoarthritis of Spine   HPI: As below and per problem based documentation when appropriate.   Niajah reports worsening neck pain. More pronounced on the RT side. There is persistent radiation of pain down both arms.   She completed a Medrol Dosepak with some relief.  Took Gabapentin for 1 week with s/e of drowsiness with fatigue, D/C medication.   Reports persistent tenderness in both RT and LT wrist. New onset of pain in RT palm.  She feels a knot under the skin that is tender to touch.   Unsure the duration it has been there.      ROS  Otherwise per HPI.  HISTORY & PERTINENT PRIOR DATA:  No specialty comments available. She reports that she has never smoked. She has never used smokeless tobacco. No results for input(s): HGBA1C, LABURIC in the last 8760 hours. Medications & Allergies reviewed per EMR Patient Active Problem List   Diagnosis Date Noted  . Bilateral wrist pain 07/21/2016  . Neck pain 07/21/2016  . Osteoarthritis of spine with radiculopathy, cervical region 07/21/2016  . GAD (generalized anxiety disorder) 07/08/2016  . Alopecia of scalp 06/01/2016  . Right carpal tunnel syndrome 05/24/2016  . Vitamin D deficiency 03/09/2016  . Anemia, iron deficiency 03/09/2016  . Allergic rhinitis 03/09/2016  . Generalized osteoarthritis of multiple sites 03/09/2016  . GERD (gastroesophageal reflux disease) 03/09/2016   Past Medical History:  Diagnosis Date  . Arthritis    HX of chronic pain from Arthritis  . Cancer of skin    scalp  . DVT, lower  extremity (HCC)    Family History  Problem Relation Age of Onset  . Pulmonary fibrosis Father   . COPD Mother    Past Surgical History:  Procedure Laterality Date  . ABDOMINAL HYSTERECTOMY    . BACK SURGERY     disk surgery  . CHOLECYSTECTOMY    . SKIN BIOPSY     Social History   Occupational History  . Not on file.   Social History Main Topics  . Smoking status: Never Smoker  . Smokeless tobacco: Never Used  . Alcohol use No  . Drug use: No  . Sexual activity: Not on file    OBJECTIVE:  VS:  HT:5' (152.4 cm)   WT:131 lb (59.4 kg)  BMI:25.6    BP:140/80  HR:86bpm  TEMP: ( )  RESP:98 % EXAM: Findings:  Adult thin female.  No acute distress.  Alert and appropriate.  Bilateral upper extremities are well aligned.  No significant limitations in elbow or hand range of motion.  Marked limitations in cervical side bending.  Well-maintained cervical rotation with terminal pain.  Pain with Spurling's compression test as well as with arm squeeze test that is minimal.  Bilateral radial pulses 2+/4.  Negative Hoffman's reflex.     No results found. ASSESSMENT & PLAN:   Problem List Items Addressed This Visit    Osteoarthritis of spine with radiculopathy, cervical region - Primary    MRI indicated this time given failure of conservative management with medications,  home therapeutic exercises and persistent symptoms greater than several months.  Given the disturbances at night and intolerance to gabapentin will have him begin on low-dose amitriptyline.  We will plan to refer to Dr. Ernestina Patches for epidural steroid injections if indicated otherwise we will plan to have her follow-up to review the results in person..      Relevant Medications   amitriptyline (ELAVIL) 25 MG tablet   Other Relevant Orders   MR Cervical Spine Wo Contrast      Follow-up: Return for MRI review.   CMA/ATC served as Education administrator during this visit. History, Physical, and Plan performed by medical provider.  Documentation and orders reviewed and attested to.      Teresa Coombs, [facility] Physician

## 2016-08-24 ENCOUNTER — Other Ambulatory Visit: Payer: Self-pay

## 2016-08-24 ENCOUNTER — Telehealth: Payer: Self-pay | Admitting: Sports Medicine

## 2016-08-24 NOTE — Telephone Encounter (Signed)
I have D/C medication off medication list.

## 2016-08-24 NOTE — Assessment & Plan Note (Signed)
MRI indicated this time given failure of conservative management with medications, home therapeutic exercises and persistent symptoms greater than several months.  Given the disturbances at night and intolerance to gabapentin will have him begin on low-dose amitriptyline.  We will plan to refer to Dr. Ernestina Patches for epidural steroid injections if indicated otherwise we will plan to have her follow-up to review the results in person.Marland Kitchen

## 2016-08-24 NOTE — Telephone Encounter (Signed)
Patient calling to state she is not going be taking amitriptyline (ELAVIL) 25 MG tablet [[id number]]  due to worries about side effects. Asked for return phone call to discuss other options.

## 2016-08-26 NOTE — Telephone Encounter (Signed)
Spoke with patient, she is aware that we will call her after she get her MRI with recommendations.

## 2016-08-26 NOTE — Telephone Encounter (Signed)
Left a message for patient to call back. I will also send message through [facility].

## 2016-08-27 ENCOUNTER — Telehealth: Payer: Self-pay | Admitting: Sports Medicine

## 2016-08-27 ENCOUNTER — Encounter: Payer: Self-pay | Admitting: Sports Medicine

## 2016-08-27 NOTE — Telephone Encounter (Signed)
Patient called to advise that she got a second opinion. She wants to cancel the MRI, as she is following another dr's plan of care.

## 2016-08-27 NOTE — Telephone Encounter (Signed)
Noted, order canceled. Will Forward to Dr. Paulla Fore as Juluis Rainier.

## 2016-08-27 NOTE — Telephone Encounter (Signed)
We are happy to cancel the MRI.  I wish her the best of luck.  I am happy to see her back at any time if she wishes.

## 2016-09-15 ENCOUNTER — Ambulatory Visit: Payer: PPO | Admitting: Sports Medicine

## 2016-09-21 ENCOUNTER — Encounter: Payer: Self-pay | Admitting: Family Medicine

## 2016-09-24 ENCOUNTER — Telehealth: Payer: Self-pay | Admitting: Family Medicine

## 2016-09-24 NOTE — Telephone Encounter (Signed)
Left voicemail letting patient know she can discontinue medication & resume it as needed. Advised to call back with any questions.

## 2016-09-24 NOTE — Telephone Encounter (Signed)
She is taking it for allergies, she can stop it and resume if needed. Thanks, BJ

## 2016-09-24 NOTE — Telephone Encounter (Signed)
Pt would like to know if Ok to stop her singular. If so, pt would like to stop tonight.

## 2016-09-24 NOTE — Telephone Encounter (Signed)
Okay to stop medication.

## 2016-10-28 DIAGNOSIS — Z85828 Personal history of other malignant neoplasm of skin: Secondary | ICD-10-CM | POA: Diagnosis not present

## 2016-10-28 DIAGNOSIS — D1801 Hemangioma of skin and subcutaneous tissue: Secondary | ICD-10-CM | POA: Diagnosis not present

## 2016-10-28 DIAGNOSIS — D225 Melanocytic nevi of trunk: Secondary | ICD-10-CM | POA: Diagnosis not present

## 2016-10-28 DIAGNOSIS — L821 Other seborrheic keratosis: Secondary | ICD-10-CM | POA: Diagnosis not present

## 2016-11-11 ENCOUNTER — Encounter: Payer: Self-pay | Admitting: Family Medicine

## 2016-12-21 IMAGING — CR DG KNEE COMPLETE 4+V*R*
4 series · 4 of 4 positions shown · non-contrast
Comparison: None.

ADDENDUM:
No significant spurring is identified
CLINICAL DATA: Fall on right knee 2 months ago with persistent
pain, initial encounter

EXAM:
RIGHT KNEE - COMPLETE 4+ VIEW

[w knee ap right]
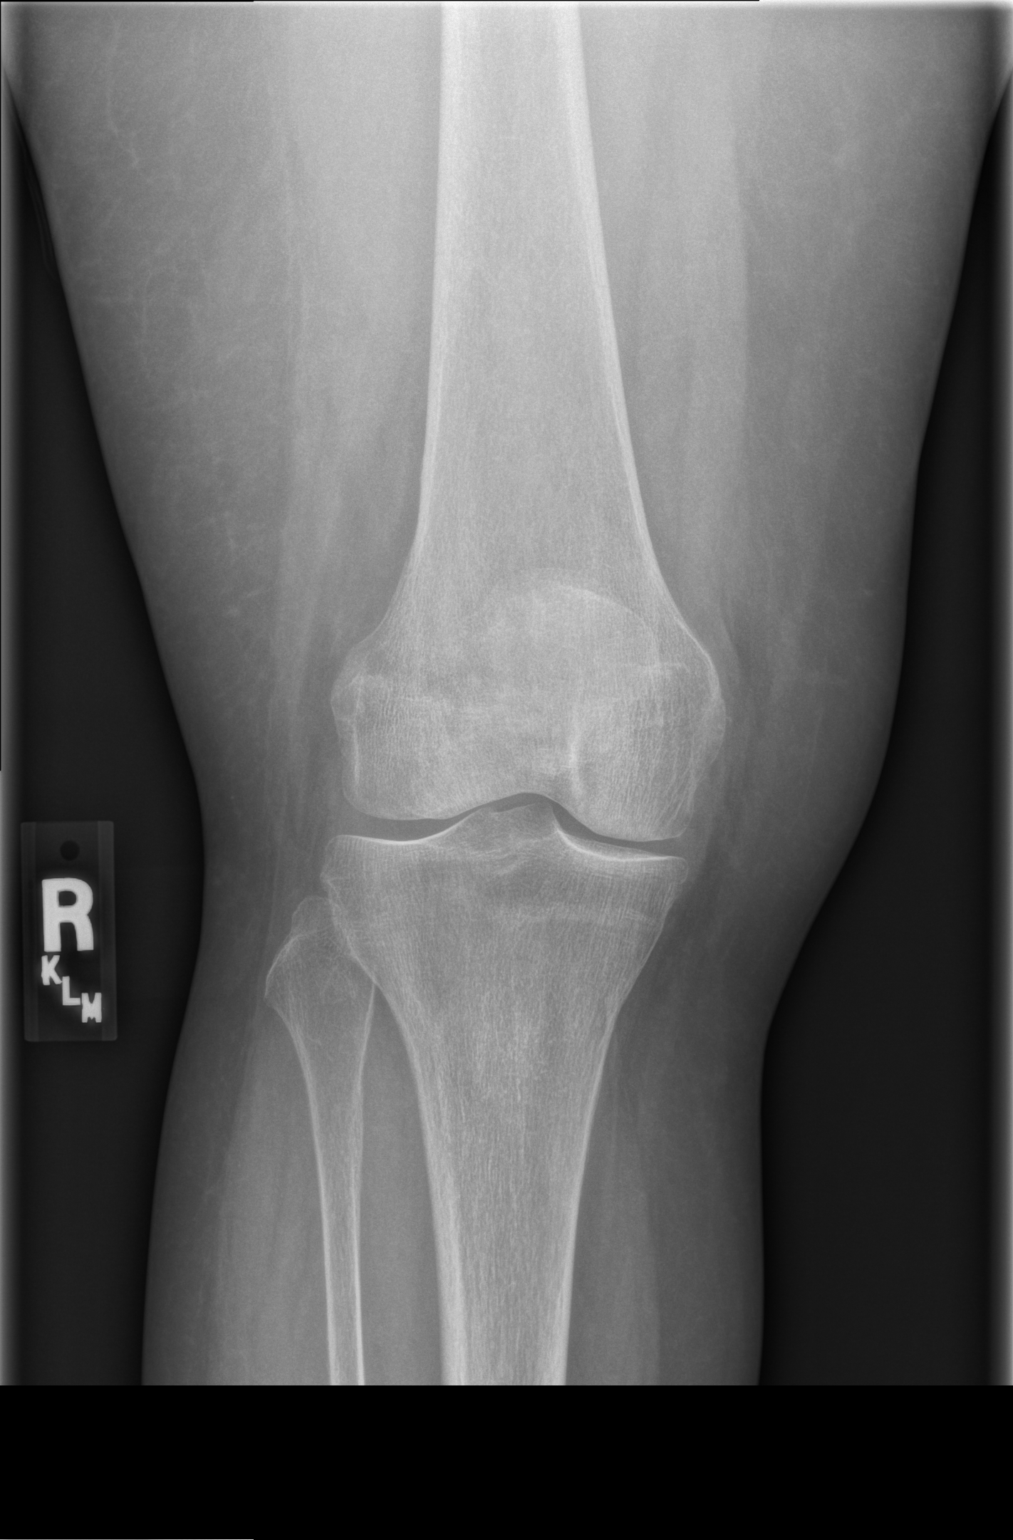

[w knee lat right]
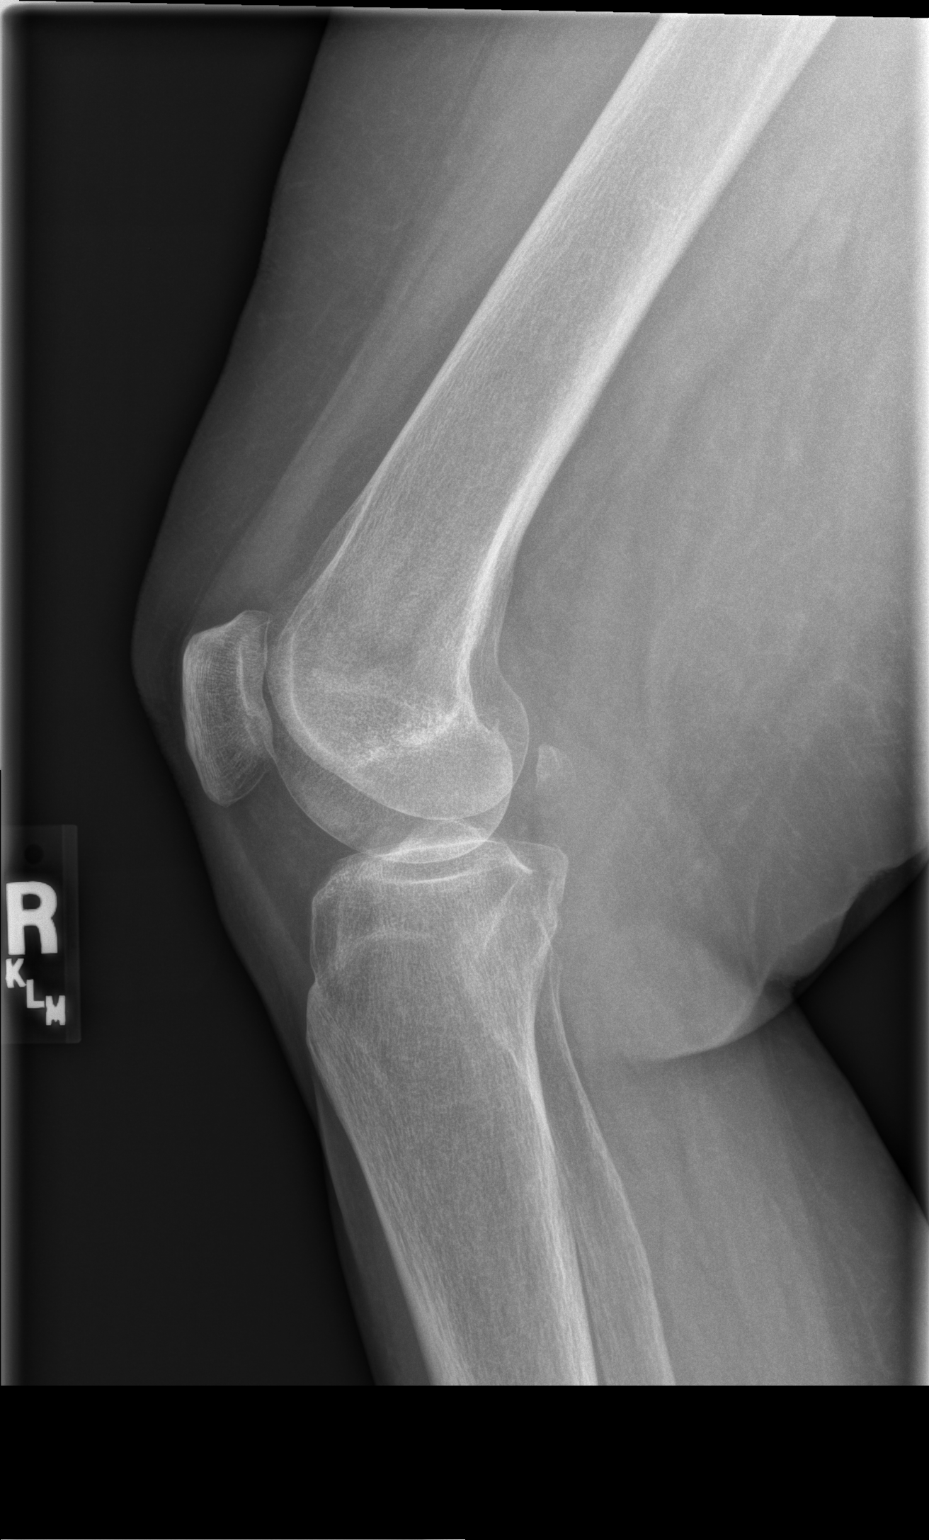

[w knee tunnel pa right]
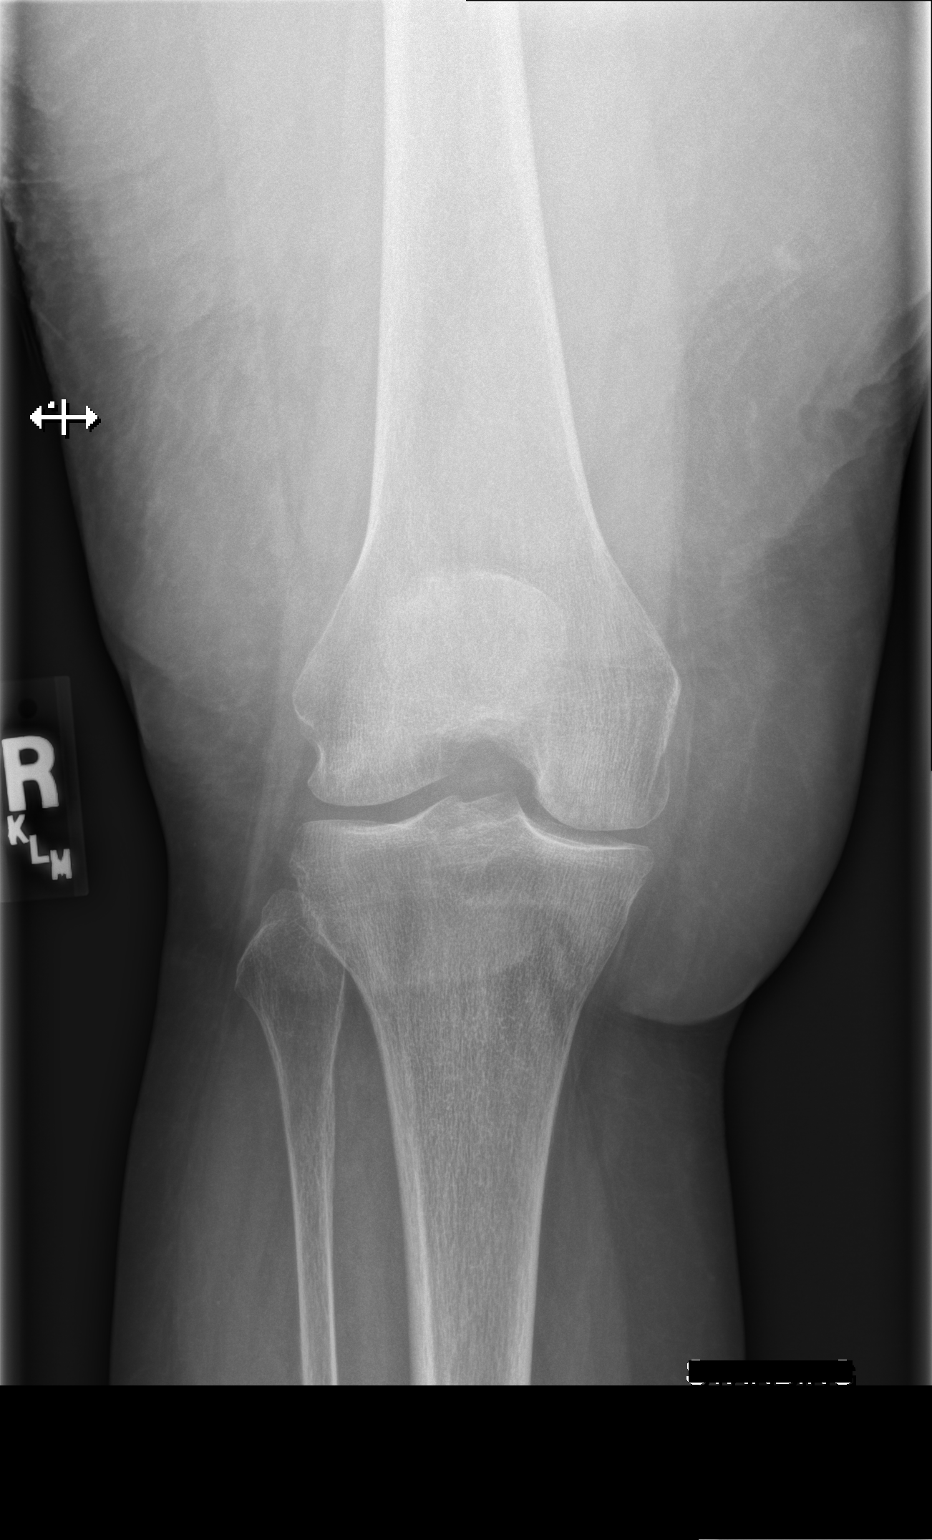

[x knee sunrise right]
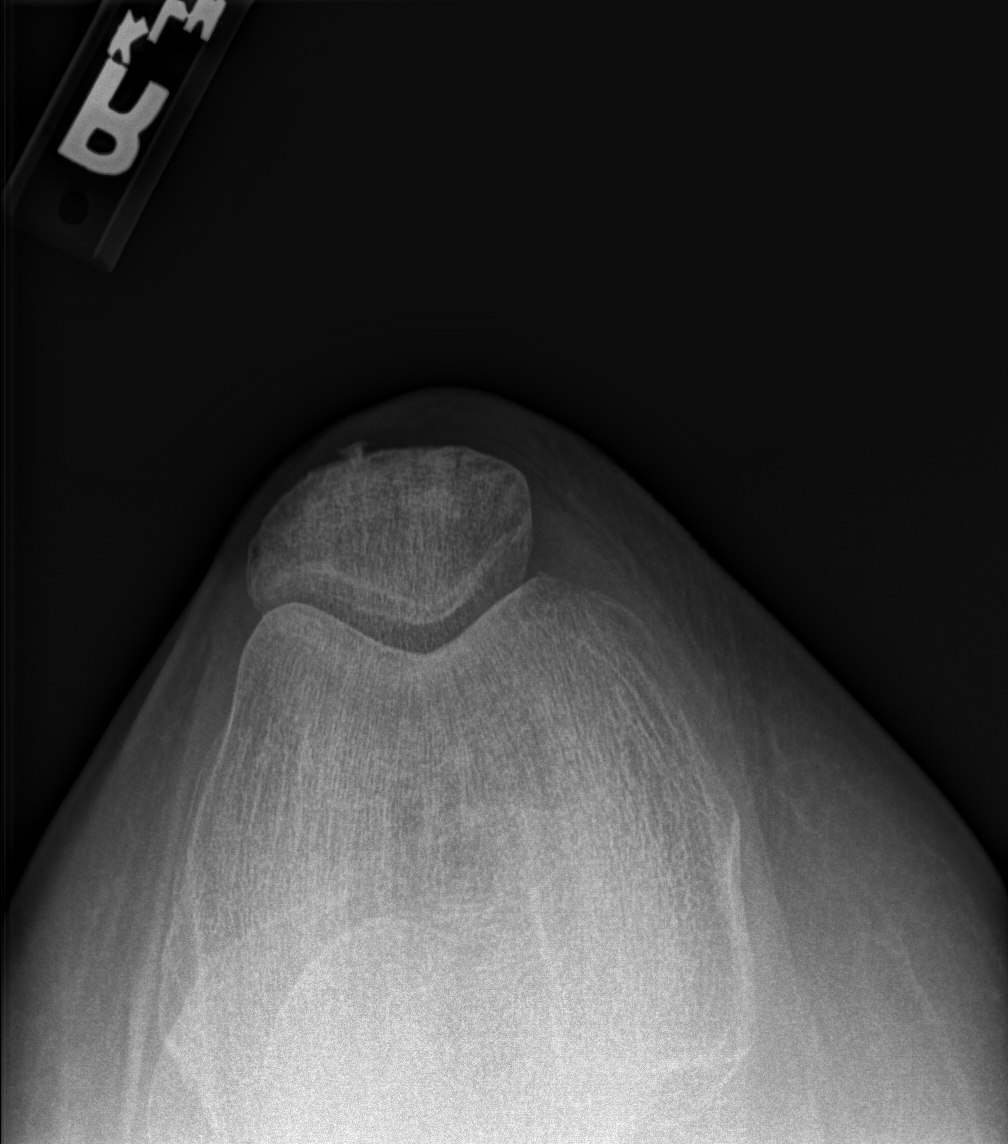

[4 of 4 positions shown; findings below may reference images not displayed]

FINDINGS: Mild degenerative changes of the medial joint space are seen. No
acute fracture or dislocation is noted. No joint effusion is noted.
IMPRESSION: Mild degenerative change without acute abnormality.

## 2016-12-28 DIAGNOSIS — M47812 Spondylosis without myelopathy or radiculopathy, cervical region: Secondary | ICD-10-CM | POA: Diagnosis not present

## 2016-12-28 DIAGNOSIS — M19011 Primary osteoarthritis, right shoulder: Secondary | ICD-10-CM | POA: Diagnosis not present

## 2016-12-28 DIAGNOSIS — M19012 Primary osteoarthritis, left shoulder: Secondary | ICD-10-CM | POA: Diagnosis not present

## 2017-02-04 ENCOUNTER — Telehealth: Payer: Self-pay | Admitting: Family Medicine

## 2017-02-04 NOTE — Telephone Encounter (Signed)
Copied from [place]. Topic: General - Other >> Feb 04, 2017 11:38 AM Darl Householder, RMA wrote: Reason for CRM: patient is requesting a call back concerning medication change for arthritis cream (Erythromycin 2%) , please call pt at [phone]

## 2017-02-07 NOTE — Telephone Encounter (Signed)
Pt is return [place] call

## 2017-02-07 NOTE — Telephone Encounter (Signed)
Left message for patient to give clinic a call back for labs. 

## 2017-02-08 NOTE — Telephone Encounter (Signed)
Voltaren gel and Erythromycin are different medications. She can try OTC [facility] or similar topical meds for joint/muscle pain. In regard to Tumeric, Petra Kuba Made is the brand I usually recommend.  Thanks, BJ

## 2017-02-08 NOTE — Telephone Encounter (Signed)
Spoke with patient, patient stated that her insurance will be changing to [place] in January and the Voltaren gel will cost $95, can it be changed to Erythromycin 2% because it will not cost her anything. Patient also wanted to know how much Tumeric she need to take daily and if there is a certain brand that she need to be using.

## 2017-02-09 NOTE — Telephone Encounter (Signed)
Spoke with patient, informed of recommendations from Dr. Martinique. Patient verbalized understanding.

## 2017-02-09 NOTE — Telephone Encounter (Signed)
Pt returning call to the office. Do not see where office called pt today. Pt stated she was called around 9 am.

## 2017-02-17 DIAGNOSIS — J019 Acute sinusitis, unspecified: Secondary | ICD-10-CM | POA: Diagnosis not present

## 2017-02-17 DIAGNOSIS — R110 Nausea: Secondary | ICD-10-CM | POA: Diagnosis not present

## 2017-02-25 DIAGNOSIS — M6281 Muscle weakness (generalized): Secondary | ICD-10-CM | POA: Diagnosis not present

## 2017-02-25 DIAGNOSIS — M4696 Unspecified inflammatory spondylopathy, lumbar region: Secondary | ICD-10-CM | POA: Diagnosis not present

## 2017-02-25 DIAGNOSIS — R52 Pain, unspecified: Secondary | ICD-10-CM | POA: Diagnosis not present

## 2017-03-01 DIAGNOSIS — M6281 Muscle weakness (generalized): Secondary | ICD-10-CM | POA: Diagnosis not present

## 2017-03-01 DIAGNOSIS — R52 Pain, unspecified: Secondary | ICD-10-CM | POA: Diagnosis not present

## 2017-03-01 DIAGNOSIS — M4696 Unspecified inflammatory spondylopathy, lumbar region: Secondary | ICD-10-CM | POA: Diagnosis not present

## 2017-03-04 DIAGNOSIS — R52 Pain, unspecified: Secondary | ICD-10-CM | POA: Diagnosis not present

## 2017-03-04 DIAGNOSIS — M4696 Unspecified inflammatory spondylopathy, lumbar region: Secondary | ICD-10-CM | POA: Diagnosis not present

## 2017-03-04 DIAGNOSIS — M6281 Muscle weakness (generalized): Secondary | ICD-10-CM | POA: Diagnosis not present

## 2017-03-08 DIAGNOSIS — M4696 Unspecified inflammatory spondylopathy, lumbar region: Secondary | ICD-10-CM | POA: Diagnosis not present

## 2017-03-08 DIAGNOSIS — R52 Pain, unspecified: Secondary | ICD-10-CM | POA: Diagnosis not present

## 2017-03-08 DIAGNOSIS — M6281 Muscle weakness (generalized): Secondary | ICD-10-CM | POA: Diagnosis not present

## 2017-03-10 DIAGNOSIS — R52 Pain, unspecified: Secondary | ICD-10-CM | POA: Diagnosis not present

## 2017-03-10 DIAGNOSIS — M4696 Unspecified inflammatory spondylopathy, lumbar region: Secondary | ICD-10-CM | POA: Diagnosis not present

## 2017-03-10 DIAGNOSIS — M6281 Muscle weakness (generalized): Secondary | ICD-10-CM | POA: Diagnosis not present

## 2017-03-17 DIAGNOSIS — M4696 Unspecified inflammatory spondylopathy, lumbar region: Secondary | ICD-10-CM | POA: Diagnosis not present

## 2017-03-17 DIAGNOSIS — M6281 Muscle weakness (generalized): Secondary | ICD-10-CM | POA: Diagnosis not present

## 2017-03-17 DIAGNOSIS — R52 Pain, unspecified: Secondary | ICD-10-CM | POA: Diagnosis not present

## 2017-03-24 DIAGNOSIS — R52 Pain, unspecified: Secondary | ICD-10-CM | POA: Diagnosis not present

## 2017-03-24 DIAGNOSIS — M6281 Muscle weakness (generalized): Secondary | ICD-10-CM | POA: Diagnosis not present

## 2017-03-24 DIAGNOSIS — M4696 Unspecified inflammatory spondylopathy, lumbar region: Secondary | ICD-10-CM | POA: Diagnosis not present

## 2017-05-24 DIAGNOSIS — H5203 Hypermetropia, bilateral: Secondary | ICD-10-CM | POA: Diagnosis not present

## 2017-05-24 DIAGNOSIS — Z0100 Encounter for examination of eyes and vision without abnormal findings: Secondary | ICD-10-CM | POA: Diagnosis not present

## 2017-05-24 DIAGNOSIS — H52223 Regular astigmatism, bilateral: Secondary | ICD-10-CM | POA: Diagnosis not present

## 2017-06-15 ENCOUNTER — Ambulatory Visit (INDEPENDENT_AMBULATORY_CARE_PROVIDER_SITE_OTHER): Payer: Medicare HMO | Admitting: Family Medicine

## 2017-06-15 ENCOUNTER — Encounter: Payer: Self-pay | Admitting: Family Medicine

## 2017-06-15 VITALS — BP 126/82 | HR 84 | Temp 98.1°F | Resp 12 | Ht 60.0 in | Wt 133.2 lb

## 2017-06-15 DIAGNOSIS — K529 Noninfective gastroenteritis and colitis, unspecified: Secondary | ICD-10-CM

## 2017-06-15 DIAGNOSIS — R110 Nausea: Secondary | ICD-10-CM | POA: Diagnosis not present

## 2017-06-15 NOTE — Progress Notes (Signed)
ACUTE VISIT   HPI:  Chief Complaint  Patient presents with  . Diarrhea    started last Thursday off and on  . Nausea    [name] is a 67 y.o. female, who is here today complaining of diarrhea, nausea, and abdominal cramps for the past 3 to 4 days.  She was feeling mildly nauseated last week. No fever, odynophagia, dysphagia, or urinary symptoms.  Abdominal cramps are mild and alleviated by defecation. Symptoms are exacerbated by food intake.  No sick contacts or recent travel. No recent antibiotic use or changes in her diet. No upper respiratory symptoms. She has taken OTC Imodium.  She is also taking Tylenol for generalized OA.  Today she has had 2 episodes of loose stools, she has not noted blood or mucus.  Yesterday she did not have diarrhea, she had a small formed stool. She felt like she was constipated due to Imodium.  She wonders if the cavities are causing her symptoms or if she is eating too many bananas.  States that she just learned that "bananas are good for you", so she started eating 3 bananas daily.  Problem is overall stable.  Review of Systems  Constitutional: Positive for appetite change and fatigue. Negative for chills, fever and unexpected weight change.  HENT: Negative for mouth sores, sore throat and trouble swallowing.   Respiratory: Negative for cough, shortness of breath and wheezing.   Cardiovascular: Negative for chest pain and palpitations.  Gastrointestinal: Positive for abdominal pain, diarrhea and nausea. Negative for blood in stool and vomiting.  Genitourinary: Negative for decreased urine volume, dysuria and hematuria.  Musculoskeletal: Positive for arthralgias (Hx of OA).  Skin: Negative for pallor and rash.  Allergic/Immunologic: Positive for environmental allergies.  Hematological: Negative for adenopathy. Does not bruise/bleed easily.  Psychiatric/Behavioral: Negative for confusion. The patient is nervous/anxious.        Current Outpatient Medications on File Prior to Visit  Medication Sig Dispense Refill  . acetaminophen (EXTRA STRENGTH PAIN RELIEF) 500 MG tablet Take 500 mg by mouth 3 (three) times daily with meals.     . B Complex-C (SUPER B COMPLEX PO) Take 1 tablet by mouth daily.    . Biotin (BIOTIN 5000) 5 MG CAPS Take 1 capsule by mouth 2 (two) times daily with breakfast and lunch.     . Calcium Carbonate-Vitamin D (CALCIUM + D) 600-200 MG-UNIT TABS Take 1 tablet by mouth 2 (two) times daily with breakfast and lunch.     . cholecalciferol (VITAMIN D) 1000 units tablet Take 1,000 Units by mouth 2 (two) times daily.    . Coenzyme Q10 (COQ10 PO) Take 1 tablet by mouth daily with breakfast.     . CRANBERRY PO Take 4,200 mg by mouth 2 (two) times daily.     . ferrous sulfate 325 (65 FE) MG tablet Take 325 mg by mouth.     Marland Kitchen glucosamine-chondroitin 500-400 MG tablet Take 1 tablet by mouth 2 (two) times daily at 8 am and 10 pm.     . Guaifenesin 1200 MG TB12 Take 600 mg by mouth 2 (two) times daily.    . Lactobacillus (ACIDOPHILUS) CAPS capsule Take 1 capsule by mouth 3 (three) times daily.     Marland Kitchen MAGNESIUM PO Take 250 mg by mouth. Take 1 tablet by mouth daily.     . Misc Natural Products (TART CHERRY ADVANCED PO) Take by mouth. Take 1 capsule a day.    . Nutritional  Supplements (JUICE PLUS FIBRE PO) Take 3 capsules by mouth 2 (two) times daily.    . Omega-3 Fatty Acids (FISH OIL) 1200 MG CAPS Take 1 capsule by mouth daily.    . TURMERIC PO Take 50 mg by mouth.    . diclofenac sodium (VOLTAREN) 1 % GEL Apply 2-4 g topically 4 (four) times daily as needed (for pain on shoulders and knees).     Marland Kitchen omeprazole (PRILOSEC OTC) 20 MG tablet Take 20 mg by mouth daily with breakfast.     No current facility-administered medications on file prior to visit.      Past Medical History:  Diagnosis Date  . Arthritis    HX of chronic pain from Arthritis  . Cancer of skin    scalp  . DVT, lower extremity  (HCC)    Allergies  Allergen Reactions  . Percocet [Oxycodone-Acetaminophen] Nausea And Vomiting  . Gabapentin Other (See Comments)    Weakness with Fatigue  . Cefuroxime Axetil   . Diflunisal     Give by dnetist had a bad reaction  . Levofloxacin     To many side effects and did not take  . Lexapro [Escitalopram Oxalate]     Patient states she had a "bad reaction"  . Nystatin     Just states she can't take this  . Penicillins     Childhood reaction  Has patient had a PCN reaction causing immediate rash, facial/tongue/throat swelling, SOB or lightheadedness with hypotension: Unknown Has patient had a PCN reaction causing severe rash involving mucus membranes or skin necrosis: Unknown Has patient had a PCN reaction that required hospitalization: Unknown Has patient had a PCN reaction occurring within the last 10 years: Unknown If all of the above answers are "NO", then may proceed with Cephalosporin use.   . Azelastine-Fluticasone Rash    Social History   Socioeconomic History  . Marital status: Widowed    Spouse name: Not on file  . Number of children: Not on file  . Years of education: Not on file  . Highest education level: Not on file  Occupational History  . Not on file  Social Needs  . Financial resource strain: Not on file  . Food insecurity:    Worry: Not on file    Inability: Not on file  . Transportation needs:    Medical: Not on file    Non-medical: Not on file  Tobacco Use  . Smoking status: Never Smoker  . Smokeless tobacco: Never Used  Substance and Sexual Activity  . Alcohol use: No  . Drug use: No  . Sexual activity: Not on file  Lifestyle  . Physical activity:    Days per week: Not on file    Minutes per session: Not on file  . Stress: Not on file  Relationships  . Social connections:    Talks on phone: Not on file    Gets together: Not on file    Attends religious service: Not on file    Active member of club or organization: Not on file      Attends meetings of clubs or organizations: Not on file    Relationship status: Not on file  Other Topics Concern  . Not on file  Social History Narrative  . Not on file    Vitals:   06/15/17 1205  BP: 126/82  Pulse: 84  Resp: 12  Temp: 98.1 F (36.7 C)  SpO2: 96%   Body mass index is 26.02 kg/m.  Physical Exam  Nursing note and vitals reviewed. Constitutional: She is oriented to person, place, and time. She appears well-developed and well-nourished. She does not appear ill. No distress.  HENT:  Head: Normocephalic and atraumatic.  Mouth/Throat: Oropharynx is clear and moist and mucous membranes are normal.  Eyes: Conjunctivae are normal. No scleral icterus.  Cardiovascular: Normal rate and regular rhythm.  No murmur heard. Respiratory: Effort normal and breath sounds normal. No respiratory distress.  GI: Soft. She exhibits no distension and no mass. Bowel sounds are increased. There is no hepatomegaly. There is no tenderness.  Musculoskeletal: She exhibits no edema.  Lymphadenopathy:    She has no cervical adenopathy.  Neurological: She is alert and oriented to person, place, and time. She has normal strength. Gait normal.  Skin: Skin is warm. No rash noted. No erythema.  Psychiatric: Her mood appears anxious.  Well groomed, good eye contact.    ASSESSMENT AND PLAN:   Ms.Teeghan was seen today for diarrhea and nausea.  Diagnoses and all orders for this visit:  Acute gastroenteritis  Nausea without vomiting  Most likely viral etiology, so symptomatic treatment recommended for now. At this time I do not think further work-up is necessary.  OTC Imodium could be used but I do not recommend unless 6 or more stools daily. She is not interested in medication for nausea, she has taken Zofran in the past and caused constipation.  She is afraid of possible side effects of medications.  Oral hydration, Gatorade or Pedialyte are good options. Bland and light diet if  tolerated. She will monitor for worsening symptoms. F/U as needed.     12:09 pm-12:28 pm face to face OV. > 50% was dedicated to discussion of Dx and treatment options as well as some side effects of antiemetic and antidiarrhea medications. I have recommended eating fruit with moderation, 1-2 small bananas is appropriate.     Kaaliyah Kita G. Martinique, MD  [facility]. [place] office.

## 2017-06-15 NOTE — Patient Instructions (Addendum)
A few things to remember from today's visit:   Acute gastroenteritis    Symptoms are most likely viral and self-limited. Good hand hygiene is important, decrease risk of transmission. Small and frequent sips of clear fluids, Pedialyte or Gatorade age good options.  BRAT diet: bananas, rice, applesauce, and toast.  If persistent vomiting stop solids for 24 hours then resume liquid/soft diet and advance as tolerated.   Notified immediately or seek medical attention if you cannot keep fluids down, decreased urine output, severe abdominal pain, red bright blood per rectum, or changes in mental status.  Follow if not full recovery in 2-3 weeks.       Please be sure medication list is accurate. If a new problem present, please set up appointment sooner than planned today.

## 2017-06-16 ENCOUNTER — Ambulatory Visit: Payer: Self-pay

## 2017-06-16 NOTE — Telephone Encounter (Signed)
Patient called in with c/o "diarrhea." She says "it started on Easter and I saw the doctor yesterday. She told me to take imodium after 5 stools. I had 3 so far today. I am keeping down the 1/2 water, 1/2 gatorade. I'm nauseous when I try to eat something. Once the stool was almost loose/soft, but the next was diarrhea again. I've monitored my urine output and it was fine. I haven't vomited, I'm not dizzy. I have a discomfort in my abdomen, but not cramping like it was before. Should I go to the emergency room?" I advised to stay home and monitor her stools. Advised if the diarrhea increases to more than 7 and she is not tolerating fluids, not urinating, severe abdominal pain, then go to the ED or call us back to be seen by the provider. She verbalized understanding of care advice.   Reason for Disposition . MILD-MODERATE diarrhea (e.g., 1-6 times / day more than normal)  Answer Assessment - Initial Assessment Questions 1. DIARRHEA SEVERITY: "How bad is the diarrhea?" "How many extra stools have you had in the past 24 hours than normal?"    - MILD: Few loose or mushy BMs; increase of 1-3 stools over normal daily number of stools; mild increase in ostomy output.   - MODERATE: Increase of 4-6 stools daily over normal; moderate increase in ostomy output.   - SEVERE (or Worst Possible): Increase of 7 or more stools daily over normal; moderate increase in ostomy output; incontinence.     Mild 2. ONSET: "When did the diarrhea begin?"      Easter 3. BM CONSISTENCY: "How loose or watery is the diarrhea?"      Watery 4. VOMITING: "Are you also vomiting?" If so, ask: "How many times in the past 24 hours?"      No 5. ABDOMINAL PAIN: "Are you having any abdominal pain?" If yes: "What does it feel like?" (e.g., crampy, dull, intermittent, constant)      Discomfort 6. ABDOMINAL PAIN SEVERITY: If present, ask: "How bad is the pain?"  (e.g., Scale 1-10; mild, moderate, or severe)    - MILD (1-3): doesn't  interfere with normal activities, abdomen soft and not tender to touch     - MODERATE (4-7): interferes with normal activities or awakens from sleep, tender to touch     - SEVERE (8-10): excruciating pain, doubled over, unable to do any normal activities       Mild 7. ORAL INTAKE: If vomiting, "Have you been able to drink liquids?" "How much fluids have you had in the past 24 hours?"     N/A 8. HYDRATION: "Any signs of dehydration?" (e.g., dry mouth [not just dry lips], too weak to stand, dizziness, new weight loss) "When did you last urinate?"     No 9. EXPOSURE: "Have you traveled to a foreign country recently?" "Have you been exposed to anyone with diarrhea?" "Could you have eaten any food that was spoiled?"     No 10. OTHER SYMPTOMS: "Do you have any other symptoms?" (e.g., fever, blood in stool)       No 11. PREGNANCY: "Is there any chance you are pregnant?" "When was your last menstrual period?"       No  Protocols used: DIARRHEA-A-AH

## 2017-06-20 ENCOUNTER — Ambulatory Visit (INDEPENDENT_AMBULATORY_CARE_PROVIDER_SITE_OTHER): Payer: Medicare HMO | Admitting: Family Medicine

## 2017-06-20 ENCOUNTER — Encounter: Payer: Self-pay | Admitting: Family Medicine

## 2017-06-20 VITALS — BP 124/60 | HR 85 | Temp 98.5°F | Resp 12 | Ht 60.0 in | Wt 132.2 lb

## 2017-06-20 DIAGNOSIS — R110 Nausea: Secondary | ICD-10-CM

## 2017-06-20 DIAGNOSIS — R197 Diarrhea, unspecified: Secondary | ICD-10-CM

## 2017-06-20 MED ORDER — OMEPRAZOLE MAGNESIUM 20 MG PO TBEC: 20.0000 mg | DELAYED_RELEASE_TABLET | Freq: Every day | ORAL | 0 refills | Status: DC

## 2017-06-20 NOTE — Patient Instructions (Signed)
A few things to remember from today's visit:   Nausea without vomiting - Plan: omeprazole (PRILOSEC OTC) 20 MG tablet  Diarrhea, unspecified type - Plan: CBC, Basic metabolic panel, TSH   Diarrhea, Adult Diarrhea is when you have loose and water poop (stool) often. Diarrhea can make you feel weak and cause you to get dehydrated. Dehydration can make you tired and thirsty, make you have a dry mouth, and make it so you pee (urinate) less often. Diarrhea often lasts 2-3 days. However, it can last longer if it is a sign of something more serious. It is important to treat your diarrhea as told by your doctor. Follow these instructions at home: Eating and drinking  Follow these recommendations as told by your doctor:  Take an oral rehydration solution (ORS). This is a drink that is sold at pharmacies and stores.  Drink clear fluids, such as: ? Water. ? Ice chips. ? Diluted fruit juice. ? Low-calorie sports drinks.  Eat bland, easy-to-digest foods in small amounts as you are able. These foods include: ? Bananas. ? Applesauce. ? Rice. ? Low-fat (lean) meats. ? Toast. ? Crackers.  Avoid drinking fluids that have a lot of sugar or caffeine in them.  Avoid alcohol.  Avoid spicy or fatty foods.  General instructions   Drink enough fluid to keep your pee (urine) clear or pale yellow.  Wash your hands often. If you cannot use soap and water, use hand sanitizer.  Make sure that all people in your home wash their hands well and often.  Take over-the-counter and prescription medicines only as told by your doctor.  Rest at home while you get better.  Watch your condition for any changes.  Take a warm bath to help with any burning or pain from having diarrhea.  Keep all follow-up visits as told by your doctor. This is important. Contact a doctor if:  You have a fever.  Your diarrhea gets worse.  You have new symptoms.  You cannot keep fluids down.  You feel light-headed  or dizzy.  You have a headache.  You have muscle cramps. Get help right away if:  You have chest pain.  You feel very weak or you pass out (faint).  You have bloody or black poop or poop that look like tar.  You have very bad pain, cramping, or bloating in your belly (abdomen).  You have trouble breathing or you are breathing very quickly.  Your heart is beating very quickly.  Your skin feels cold and clammy.  You feel confused.  You have signs of dehydration, such as: ? Dark pee, hardly any pee, or no pee. ? Cracked lips. ? Dry mouth. ? Sunken eyes. ? Sleepiness. ? Weakness. This information is not intended to replace advice given to you by your health care provider. Make sure you discuss any questions you have with your health care provider. Document Released: 07/28/2007 Document Revised: 08/29/2015 Document Reviewed: 10/15/2014 Elsevier Interactive Patient Education  2018 [employer].  Please be sure medication list is accurate. If a new problem present, please set up appointment sooner than planned today.

## 2017-06-20 NOTE — Progress Notes (Addendum)
ACUTE VISIT   HPI:  Chief Complaint  Patient presents with  . Abdominal issues    still having GI issues, sick feeling and rumbling    [name] is a 67 y.o. female, who is here today complaining of persistent diarrhea and nausea.  She was seen on 06/15/2017 because of 3 days of loose stools and nausea. + Sick contact with similar symptoms.  Since her last visit she has been evaluated in a acute care facility, 2 days ago.  According to patient, it was recommended to continue same treatment and avoiding Imodium.  Today she has had 2 stools, yesterday she had 4. "Rumble" sensation on upper abdomen, no pain but more like discomfort.  It is alleviated sometimes by defecation. She has not noted blood or mucus in her stool. Symptoms are exacerbated by certain food intake, she has not identified that alleviating factors.  No fever, worsening fatigue, or urinary symptoms. + Occasional chills.  She tolerates well cracker, bread, and bananas.  She is concerned about not being able to eat regular food , afraid of "malnutrition."   History of GERD, she has not had heartburn, having some burping.  In the past she was on omeprazole 20 mg, she does not remember why it was discontinued.  Also history of IBS diarrhea but has not had symptoms for years. No recent antibiotic use or travel.   Review of Systems  Constitutional: Positive for chills and fatigue. Negative for appetite change and fever.  HENT: Negative for mouth sores, sore throat and trouble swallowing.   Respiratory: Negative for cough, shortness of breath and wheezing.   Cardiovascular: Negative for chest pain.  Gastrointestinal: Positive for diarrhea and nausea. Negative for abdominal distention, blood in stool and vomiting.  Endocrine: Negative for cold intolerance and heat intolerance.  Genitourinary: Negative for decreased urine volume, dysuria and hematuria.  Musculoskeletal: Negative for back pain  and myalgias.  Skin: Negative for rash.  Neurological: Negative for weakness and headaches.      Current Outpatient Medications on File Prior to Visit  Medication Sig Dispense Refill  . acetaminophen (EXTRA STRENGTH PAIN RELIEF) 500 MG tablet Take 500 mg by mouth 3 (three) times daily with meals.     . B Complex-C (SUPER B COMPLEX PO) Take 1 tablet by mouth daily.    . Biotin (BIOTIN 5000) 5 MG CAPS Take 1 capsule by mouth 2 (two) times daily with breakfast and lunch.     . Calcium Carbonate-Vitamin D (CALCIUM + D) 600-200 MG-UNIT TABS Take 1 tablet by mouth 2 (two) times daily with breakfast and lunch.     . cholecalciferol (VITAMIN D) 1000 units tablet Take 1,000 Units by mouth 2 (two) times daily.    . Coenzyme Q10 (COQ10 PO) Take 1 tablet by mouth daily with breakfast.     . CRANBERRY PO Take 4,200 mg by mouth 2 (two) times daily.     . ferrous sulfate 325 (65 FE) MG tablet Take 325 mg by mouth.     Marland Kitchen glucosamine-chondroitin 500-400 MG tablet Take 1 tablet by mouth 2 (two) times daily at 8 am and 10 pm.     . Guaifenesin 1200 MG TB12 Take 600 mg by mouth 2 (two) times daily.    . Lactobacillus (ACIDOPHILUS) CAPS capsule Take 1 capsule by mouth 3 (three) times daily.     Marland Kitchen MAGNESIUM PO Take 250 mg by mouth. Take 1 tablet by mouth daily.     Marland Kitchen  Misc Natural Products (TART CHERRY ADVANCED PO) Take by mouth. Take 1 capsule a day.    . Nutritional Supplements (JUICE PLUS FIBRE PO) Take 3 capsules by mouth 2 (two) times daily.    . Omega-3 Fatty Acids (FISH OIL) 1200 MG CAPS Take 1 capsule by mouth daily.    . TURMERIC PO Take 500 mg by mouth.      No current facility-administered medications on file prior to visit.      Past Medical History:  Diagnosis Date  . Arthritis    HX of chronic pain from Arthritis  . Cancer of skin    scalp  . DVT, lower extremity (HCC)    Allergies  Allergen Reactions  . Percocet [Oxycodone-Acetaminophen] Nausea And Vomiting  . Gabapentin Other (See  Comments)    Weakness with Fatigue  . Cefuroxime Axetil   . Diflunisal     Give by dnetist had a bad reaction  . Levofloxacin     To many side effects and did not take  . Lexapro [Escitalopram Oxalate]     Patient states she had a "bad reaction"  . Nystatin     Just states she can't take this  . Penicillins     Childhood reaction  Has patient had a PCN reaction causing immediate rash, facial/tongue/throat swelling, SOB or lightheadedness with hypotension: Unknown Has patient had a PCN reaction causing severe rash involving mucus membranes or skin necrosis: Unknown Has patient had a PCN reaction that required hospitalization: Unknown Has patient had a PCN reaction occurring within the last 10 years: Unknown If all of the above answers are "NO", then may proceed with Cephalosporin use.   . Azelastine-Fluticasone Rash    Social History   Socioeconomic History  . Marital status: Widowed    Spouse name: Not on file  . Number of children: Not on file  . Years of education: Not on file  . Highest education level: Not on file  Occupational History  . Not on file  Social Needs  . Financial resource strain: Not on file  . Food insecurity:    Worry: Not on file    Inability: Not on file  . Transportation needs:    Medical: Not on file    Non-medical: Not on file  Tobacco Use  . Smoking status: Never Smoker  . Smokeless tobacco: Never Used  Substance and Sexual Activity  . Alcohol use: No  . Drug use: No  . Sexual activity: Not on file  Lifestyle  . Physical activity:    Days per week: Not on file    Minutes per session: Not on file  . Stress: Not on file  Relationships  . Social connections:    Talks on phone: Not on file    Gets together: Not on file    Attends religious service: Not on file    Active member of club or organization: Not on file    Attends meetings of clubs or organizations: Not on file    Relationship status: Not on file  Other Topics Concern  . Not  on file  Social History Narrative  . Not on file    Vitals:   06/20/17 1631  BP: 124/60  Pulse: 85  Resp: 12  Temp: 98.5 F (36.9 C)  SpO2: 96%   Body mass index is 25.83 kg/m.   Physical Exam  Nursing note and vitals reviewed. Constitutional: She is oriented to person, place, and time. She appears well-developed and well-nourished. She  does not appear ill. No distress.  HENT:  Head: Normocephalic and atraumatic.  Mouth/Throat: Oropharynx is clear and moist. Mucous membranes are dry.  Eyes: Conjunctivae are normal.  Cardiovascular: Normal rate and regular rhythm.  No murmur heard. Respiratory: Effort normal and breath sounds normal. No respiratory distress.  GI: Soft. Bowel sounds are normal. She exhibits no distension and no mass. There is no hepatomegaly. There is no tenderness.  Musculoskeletal: She exhibits no edema.  Lymphadenopathy:    She has no cervical adenopathy.  Neurological: She is alert and oriented to person, place, and time. She has normal strength. Gait normal.  Skin: Skin is warm. No rash noted. No erythema.  Psychiatric: Her mood appears anxious.  Well groomed, good eye contact.    ASSESSMENT AND PLAN:   Ms. Azalya was seen today for abdominal issues.  Diagnoses and all orders for this visit:  Nausea without vomiting -     omeprazole (PRILOSEC OTC) 20 MG tablet; Take 1 tablet (20 mg total) by mouth daily with breakfast.  Diarrhea, unspecified type -     CBC -     Basic metabolic panel -     TSH    We discussed possible etiologies, most likely viral but IBS also to be considered. For now I will obtain a stool culture is necessary given the fact she has not been on antibiotics and no history of travel.  If diarrhea is persistent in 2 weeks we need to consider further testing. In the past she has taken Zofran but it caused constipation, so she is not interested in taking medication for nausea. She can take Imodium if she has 5-6 stools per day.   Some side effects discussed.  Nausea could also be related to GERD/dyspepsia, omeprazole 20 mg might help, she will recommend resuming it. Recommend advancing diet as tolerated. Adequate hydration. Further recommendation will be given according to lab results.  If symptoms are persistent we may need to consider GI evaluation.   5:31 pm to 5:57 pm.  > 50% was dedicated to discussion of differential dx, prognosis, treatment options, and some side effects of medications.  Return in about 2 weeks (around 07/04/2017).       Ardian Haberland G. Martinique, MD  [facility]. [place] office.

## 2017-06-21 LAB — BASIC METABOLIC PANEL
BUN: 6 mg/dL (ref 6–23)
CO2: 27 meq/L (ref 19–32)
CREATININE: 0.46 mg/dL (ref 0.40–1.20)
Calcium: 10.4 mg/dL (ref 8.4–10.5)
Chloride: 100 mEq/L (ref 96–112)
GFR: 143.4 mL/min (ref >60.00)
Glucose, Bld: 94 mg/dL (ref 70–99)
Potassium: 3.3 mEq/L — ABNORMAL LOW (ref 3.5–5.1)
Sodium: 139 mEq/L (ref 135–145)

## 2017-06-21 LAB — CBC
HEMATOCRIT: 41.3 % (ref 36.0–46.0)
HEMOGLOBIN: 14 g/dL (ref 12.0–15.0)
MCHC: 33.9 g/dL (ref 30.0–36.0)
MCV: 94.6 fl (ref 78.0–100.0)
Platelets: 258 10*3/uL (ref 150.0–400.0)
RBC: 4.37 Mil/uL (ref 3.87–5.11)
RDW: 12.6 % (ref 11.5–15.5)
WBC: 7.4 10*3/uL (ref 4.0–10.5)

## 2017-06-21 LAB — TSH: TSH: 2.09 u[IU]/mL (ref 0.35–4.50)

## 2017-06-26 ENCOUNTER — Other Ambulatory Visit: Payer: Self-pay | Admitting: Family Medicine

## 2017-06-26 ENCOUNTER — Encounter: Payer: Self-pay | Admitting: Family Medicine

## 2017-06-26 DIAGNOSIS — E876 Hypokalemia: Secondary | ICD-10-CM

## 2017-06-26 MED ORDER — POTASSIUM CHLORIDE ER 10 MEQ PO TBCR: 10.0000 meq | EXTENDED_RELEASE_TABLET | Freq: Two times a day (BID) | ORAL | 0 refills | Status: DC

## 2017-06-27 ENCOUNTER — Telehealth: Payer: Self-pay | Admitting: Family Medicine

## 2017-06-27 ENCOUNTER — Ambulatory Visit: Payer: Self-pay

## 2017-06-27 NOTE — Telephone Encounter (Signed)
This encounter was created in error - please disregard.

## 2017-06-27 NOTE — Telephone Encounter (Signed)
Pt given results per notes of Dr Martinique on 06/26/17. Unable to document in result note due to result note not being routed to [facility]. Pt has picked up her RX but may need OV. Pt to call back to discuss sx.    Notes recorded by Martinique, Betty G, MD on 06/26/2017 at 8:07 PM EDT -K+ mildly under normal range, this is most likely related to diarrhea. KDUR 10 meq bid for 3-5 days then continue potassium rich diet. We can repeat lab in 4 weeks,lab order placed and Rx sent.  Pt is experiencing Stomach pain and nausea and dizziness. Please see triage note.

## 2017-06-27 NOTE — Telephone Encounter (Addendum)
Pt called to get lab results (see results note). During call she stated that she still feels "miserable." She stated that she is able to drink water and gatorade but is eating poorly. She stated she is having dizziness that comes and goes. She stated her stools are a "pudding like " consistency. Pt stated she is nauseated. She stated that her stomach feels crampy she feels like it is "churning". Pt stated that she was put on Prilosec but doesn't feel like it helping. Pt was at [facility] during call and could not finish triage. Asked pt to call back to further discuss. Pt needs repeat labs in 4 weeks. Pt's potassium is low and she is being started on a supplement. Care advice given for dizziness and to call back if worse. Advised pt to sit on the side of the bed or chair before standing. Advised to avoid sudden head movements. Advised pt to call or go to ED if worsens. Appt rescheduled for 06/29/17 for f/u and lab appt scheduled for 07/26/17 for potassium recheck.         Answer Assessment - Initial Assessment Questions 1. DESCRIPTION: "Describe your dizziness."     Feels lightheaded and weak Pt states the lightheadedness comes and goes. 2. LIGHTHEADED: "Do you feel lightheaded?" (e.g., somewhat faint, woozy, weak upon standing) Swimmy headed 3. VERTIGO: "Do you feel like either you or the room is spinning or tilting?" (i.e. vertigo)     no 4. SEVERITY: "How bad is it?"  "Do you feel like you are going to faint?" "Can you stand and walk?"   - MILD - walking normally   - MODERATE - interferes with normal activities (e.g., work, school)    - SEVERE - unable to stand, requires support to walk, feels like passing out now.      mild 5. ONSET:  "When did the dizziness begin?" Pt is unsure 6. AGGRAVATING FACTORS: "Does anything make it worse?" (e.g., standing, change in head position)     Pt states it comes and goes 7. HEART RATE: "Can you tell me your heart rate?" "How many beats in 15 seconds?"  (Note:  not all patients can do this)      *No Answer* 8. CAUSE: "What do you think is causing the dizziness?"     *No Answer* 9. RECURRENT SYMPTOM: "Have you had dizziness before?" If so, ask: "When was the last time?" "What happened that time?"     *No Answer* 10. OTHER SYMPTOMS: "Do you have any other symptoms?" (e.g., fever, chest pain, vomiting, diarrhea, bleeding)       Pt states that it feels like her stomach is churning. 11. PREGNANCY: "Is there any chance you are pregnant?" "When was your last menstrual period?" n/a  Protocols used: DIZZINESS [facility]

## 2017-06-28 ENCOUNTER — Ambulatory Visit: Payer: Self-pay | Admitting: *Deleted

## 2017-06-28 NOTE — Telephone Encounter (Signed)
Pt states she has been experiencing diarrhea since Easter and pt states she was advised by Dr. Martinique to take Imodium for loose stools. Pt states she normally has 4 loose stools a day but today she had approximately 5. Pt also states that she had something that upset her with a friend happen today and thought this may have contributed to the additional loose stools. Pt given home care advise and advised to take Imodium for diarrhea as instructions state on medication. Pt advised that if symptoms worsen or diarrhea persist before seen for appt on tomorrow with Dr. Martinique to seek care in the ED. Pt verbalized understanding.  Reason for Disposition . MILD-MODERATE diarrhea (e.g., 1-6 times / day more than normal)  Protocols used: DIARRHEA-A-AH

## 2017-06-29 ENCOUNTER — Encounter: Payer: Self-pay | Admitting: Family Medicine

## 2017-06-29 ENCOUNTER — Ambulatory Visit (INDEPENDENT_AMBULATORY_CARE_PROVIDER_SITE_OTHER): Payer: Medicare HMO | Admitting: Family Medicine

## 2017-06-29 VITALS — BP 120/76 | HR 60 | Temp 97.9°F | Resp 12 | Ht 60.0 in | Wt 131.1 lb

## 2017-06-29 DIAGNOSIS — E876 Hypokalemia: Secondary | ICD-10-CM

## 2017-06-29 DIAGNOSIS — R110 Nausea: Secondary | ICD-10-CM | POA: Diagnosis not present

## 2017-06-29 DIAGNOSIS — R197 Diarrhea, unspecified: Secondary | ICD-10-CM | POA: Diagnosis not present

## 2017-06-29 LAB — POTASSIUM: Potassium: 4.7 mEq/L (ref 3.5–5.1)

## 2017-06-29 MED ORDER — ONDANSETRON HCL 4 MG PO TABS: 4.0000 mg | ORAL_TABLET | Freq: Three times a day (TID) | ORAL | 0 refills | Status: DC | PRN

## 2017-06-29 NOTE — Progress Notes (Signed)
ACUTE VISIT   HPI:  Chief Complaint  Patient presents with  . Follow-up    sx came back, diarrhea and abdominal pain yesterday    [name] is a 67 y.o. female, who is here today complaining of persistent diarrhea, abdominal cramps,and nausea. Exacerbated by food intake. Yesterday she had 5 episodes of diarrhea.  Symptoms started on 06/12/2017, she was seen on April 24, thought he was acute gastroenteritis. She has history of IBS diarrhea but has not have symptoms for years. She has had nausea intermittently for a while but seems to be worse.  She has not noted fever but has had some chills.  She tells me that she usually had chills when she is upset.  No changes in appetite but she is afraid of eating because symptoms are exacerbated by food intake. No alleviating factors identified.  Periumbilical abdominal pain, intermittently, cramps, sometimes alleviated by defecation. Abdominal pain is also exacerbated by food and happens right before bowel movements.  No urinary symptoms.  She has recently learned that 2 of her friends are also having diarrhea and nausea.   She has not been on antibiotic treatment in the past few months and no recent travel. She has not noted blood or mucus in stool.  Hypokalemia: She has been on potassium supplementation for 3 days. She has not noted palpitations of muscle cramps.   Review of Systems  Constitutional: Positive for fatigue. Negative for activity change, appetite change, fever and unexpected weight change.  HENT: Negative for mouth sores, sore throat and trouble swallowing.   Respiratory: Negative for cough, shortness of breath and wheezing.   Cardiovascular: Negative for leg swelling.  Gastrointestinal: Positive for abdominal pain, diarrhea and nausea. Negative for abdominal distention, blood in stool and vomiting.  Endocrine: Negative for cold intolerance and heat intolerance.  Genitourinary: Negative for  decreased urine volume, dysuria and hematuria.  Musculoskeletal: Positive for arthralgias, back pain and myalgias. Negative for gait problem and neck pain.       History of generalized OA and fibromyalgia.  Neurological: Negative for syncope, weakness and headaches.  Hematological: Negative for adenopathy. Does not bruise/bleed easily.  Psychiatric/Behavioral: Negative for confusion. The patient is nervous/anxious.       Current Outpatient Medications on File Prior to Visit  Medication Sig Dispense Refill  . acetaminophen (EXTRA STRENGTH PAIN RELIEF) 500 MG tablet Take 500 mg by mouth 3 (three) times daily with meals.     . B Complex-C (SUPER B COMPLEX PO) Take 1 tablet by mouth daily.    . Biotin (BIOTIN 5000) 5 MG CAPS Take 1 capsule by mouth 2 (two) times daily with breakfast and lunch.     . Calcium Carbonate-Vitamin D (CALCIUM + D) 600-200 MG-UNIT TABS Take 1 tablet by mouth 2 (two) times daily with breakfast and lunch.     . cholecalciferol (VITAMIN D) 1000 units tablet Take 1,000 Units by mouth 2 (two) times daily.    . Coenzyme Q10 (COQ10 PO) Take 1 tablet by mouth daily with breakfast.     . CRANBERRY PO Take 4,200 mg by mouth 2 (two) times daily.     . ferrous sulfate 325 (65 FE) MG tablet Take 325 mg by mouth.     Marland Kitchen glucosamine-chondroitin 500-400 MG tablet Take 1 tablet by mouth 2 (two) times daily at 8 am and 10 pm.     . Guaifenesin 1200 MG TB12 Take 600 mg by mouth 2 (two) times daily.    Marland Kitchen  Lactobacillus (ACIDOPHILUS) CAPS capsule Take 1 capsule by mouth 3 (three) times daily.     Marland Kitchen MAGNESIUM PO Take 250 mg by mouth. Take 1 tablet by mouth daily.     . Misc Natural Products (TART CHERRY ADVANCED PO) Take by mouth. Take 1 capsule a day.    . Nutritional Supplements (JUICE PLUS FIBRE PO) Take 3 capsules by mouth 2 (two) times daily.    . Omega-3 Fatty Acids (FISH OIL) 1200 MG CAPS Take 1 capsule by mouth daily.    Marland Kitchen omeprazole (PRILOSEC OTC) 20 MG tablet Take 1 tablet (20 mg  total) by mouth daily with breakfast. 30 tablet 0  . potassium chloride (K-DUR) 10 MEQ tablet Take 1 tablet (10 mEq total) by mouth 2 (two) times daily for 5 days. 10 tablet 0  . potassium chloride (K-DUR,KLOR-CON) 10 MEQ tablet   0  . TURMERIC PO Take 500 mg by mouth.      No current facility-administered medications on file prior to visit.      Past Medical History:  Diagnosis Date  . Arthritis    HX of chronic pain from Arthritis  . Cancer of skin    scalp  . DVT, lower extremity (HCC)    Allergies  Allergen Reactions  . Percocet [Oxycodone-Acetaminophen] Nausea And Vomiting  . Gabapentin Other (See Comments)    Weakness with Fatigue  . Cefuroxime Axetil   . Diflunisal     Give by dnetist had a bad reaction  . Levofloxacin     To many side effects and did not take  . Lexapro [Escitalopram Oxalate]     Patient states she had a "bad reaction"  . Nystatin     Just states she can't take this  . Penicillins     Childhood reaction  Has patient had a PCN reaction causing immediate rash, facial/tongue/throat swelling, SOB or lightheadedness with hypotension: Unknown Has patient had a PCN reaction causing severe rash involving mucus membranes or skin necrosis: Unknown Has patient had a PCN reaction that required hospitalization: Unknown Has patient had a PCN reaction occurring within the last 10 years: Unknown If all of the above answers are "NO", then may proceed with Cephalosporin use.   . Azelastine-Fluticasone Rash    Social History   Socioeconomic History  . Marital status: Widowed    Spouse name: Not on file  . Number of children: Not on file  . Years of education: Not on file  . Highest education level: Not on file  Occupational History  . Not on file  Social Needs  . Financial resource strain: Not on file  . Food insecurity:    Worry: Not on file    Inability: Not on file  . Transportation needs:    Medical: Not on file    Non-medical: Not on file    Tobacco Use  . Smoking status: Never Smoker  . Smokeless tobacco: Never Used  Substance and Sexual Activity  . Alcohol use: No  . Drug use: No  . Sexual activity: Not on file  Lifestyle  . Physical activity:    Days per week: Not on file    Minutes per session: Not on file  . Stress: Not on file  Relationships  . Social connections:    Talks on phone: Not on file    Gets together: Not on file    Attends religious service: Not on file    Active member of club or organization: Not on file  Attends meetings of clubs or organizations: Not on file    Relationship status: Not on file  Other Topics Concern  . Not on file  Social History Narrative  . Not on file    Vitals:   06/29/17 1103  BP: 120/76  Pulse: 60  Resp: 12  Temp: 97.9 F (36.6 C)  SpO2: 98%   Body mass index is 25.61 kg/m.   Physical Exam  Nursing note and vitals reviewed. Constitutional: She is oriented to person, place, and time. She appears well-developed and well-nourished. She does not appear ill. No distress.  HENT:  Head: Normocephalic and atraumatic.  Mouth/Throat: Oropharynx is clear and moist and mucous membranes are normal.  Eyes: Conjunctivae are normal. No scleral icterus.  Cardiovascular: Normal rate and regular rhythm.  No murmur heard. Respiratory: Effort normal and breath sounds normal. No respiratory distress.  GI: Soft. Bowel sounds are normal. She exhibits no distension and no mass. There is no hepatomegaly. There is no tenderness.  Musculoskeletal: She exhibits no edema.  Lymphadenopathy:    She has no cervical adenopathy.       Right: No supraclavicular adenopathy present.       Left: No supraclavicular adenopathy present.  Neurological: She is alert and oriented to person, place, and time. She has normal strength. Gait normal.  Skin: Skin is warm. No rash noted. No erythema.  Psychiatric: Her mood appears anxious.  Well groomed, good eye contact.      ASSESSMENT AND  PLAN:   [name] was seen today for follow-up.  Diagnoses and all orders for this visit:  Diarrhea, unspecified type  We reviewed possible causes. ? IBS Adequate hydration. Further recommendations will be given according to stool results. We need to consider GI evaluation if symptoms are persistent.  -     Ova and parasite examination; Future -     Stool culture; Future -     C. difficile GDH and Toxin A/B; Future   Hypokalemia  Further recommendations will be given according to lab results.  -     Potassium  Nausea without vomiting  Regular diet as tolerated. She agrees with trying Zofran 4 mg tablet 3 times daily as needed for nausea.  -     ondansetron (ZOFRAN) 4 MG tablet; Take 1 tablet (4 mg total) by mouth every 8 (eight) hours as needed for nausea or vomiting.        Barron Vanloan G. Martinique, MD  [facility]. [place] office.

## 2017-06-29 NOTE — Telephone Encounter (Signed)
Patient coming into office to discuss sx with PCP on 06/29/17.

## 2017-06-29 NOTE — Patient Instructions (Addendum)
A few things to remember from today's visit:   Diarrhea, unspecified type - Plan: Ova and parasite examination, Stool culture, C. difficile GDH and Toxin A/B  Hypokalemia - Plan: Potassium  Regular diet as tolerated. Avoid fats and spicy foods.   Please be sure medication list is accurate. If a new problem present, please set up appointment sooner than planned today.

## 2017-06-30 ENCOUNTER — Encounter: Payer: Self-pay | Admitting: Family Medicine

## 2017-07-04 ENCOUNTER — Ambulatory Visit: Payer: Medicare HMO | Admitting: Family Medicine

## 2017-07-04 LAB — C. DIFFICILE GDH AND TOXIN A/B
GDH ANTIGEN: NOT DETECTED
MICRO NUMBER:: 90567606
SPECIMEN QUALITY:: ADEQUATE
TOXIN A AND B: NOT DETECTED

## 2017-07-04 LAB — OVA AND PARASITE EXAMINATION
CONCENTRATE RESULT:: NONE SEEN
MICRO NUMBER:: 90571640
SPECIMEN QUALITY:: ADEQUATE
TRICHROME RESULT: NONE SEEN

## 2017-07-04 LAB — STOOL CULTURE
MICRO NUMBER: 90571638
MICRO NUMBER: 90571641
MICRO NUMBER:: 90571639
SHIGA RESULT:: NOT DETECTED
SPECIMEN QUALITY: ADEQUATE
SPECIMEN QUALITY:: ADEQUATE
SPECIMEN QUALITY:: ADEQUATE

## 2017-07-05 ENCOUNTER — Encounter: Payer: Self-pay | Admitting: Family Medicine

## 2017-07-06 ENCOUNTER — Encounter: Payer: Self-pay | Admitting: Family Medicine

## 2017-07-06 ENCOUNTER — Ambulatory Visit (INDEPENDENT_AMBULATORY_CARE_PROVIDER_SITE_OTHER): Payer: Medicare HMO | Admitting: Family Medicine

## 2017-07-06 VITALS — BP 122/74 | HR 83 | Temp 97.6°F | Resp 12 | Ht 60.0 in | Wt 131.2 lb

## 2017-07-06 DIAGNOSIS — K219 Gastro-esophageal reflux disease without esophagitis: Secondary | ICD-10-CM

## 2017-07-06 DIAGNOSIS — E876 Hypokalemia: Secondary | ICD-10-CM | POA: Diagnosis not present

## 2017-07-06 DIAGNOSIS — R197 Diarrhea, unspecified: Secondary | ICD-10-CM | POA: Diagnosis not present

## 2017-07-06 MED ORDER — POTASSIUM CHLORIDE CRYS ER 10 MEQ PO TBCR: 10.0000 meq | EXTENDED_RELEASE_TABLET | Freq: Every day | ORAL | 0 refills | Status: DC

## 2017-07-06 NOTE — Progress Notes (Signed)
HPI:   [name] is a 67 y.o. female, who is here today c/o diarrhea that started back today.  She also has questions about some of her medications,wants to know if she needs to continue them: K-DUR,Prilosec,and Zofran.  She was seen on 06/29/17 due to persistent GI symptoms: Diarrhea,abdominal cramps,and nausea. She has had GI symptoms since 06/12/17. Symptoms improved for a few days, no diarrhea since 06/30/17 until today. She is not sure if she is having nausea.Zofran was prescribed last visit, she has not taken it.  No fever,chills,worsening fatigue,vomiting,or urinary symptoms.  Periumbilical intermittent "terrible" abdominal cramps, exacerbated by food intake. No alleviating factors identified.   She has not noted blood or mucus in stool. Stool Cx's negative.  HypoK+,she is on K-DUR 10 meq daily,decreased recently from 2 tabs daily.  Lab Results  Component Value Date   CREATININE 0.46 06/20/2017   BUN 6 06/20/2017   NA 139 06/20/2017   K 4.7 06/29/2017   CL 100 06/20/2017   CO2 27 06/20/2017      Hx of GERD, she has been on Prilosec 20 mg. She has not had heartburn  She has complained of nausea in the past.   Review of Systems  Constitutional: Positive for appetite change and fatigue. Negative for chills and fever.  HENT: Negative for mouth sores, sore throat and trouble swallowing.   Respiratory: Negative for shortness of breath and wheezing.   Cardiovascular: Negative for chest pain, palpitations and leg swelling.  Gastrointestinal: Positive for abdominal pain and diarrhea. Negative for blood in stool, nausea and vomiting.  Genitourinary: Negative for decreased urine volume, dysuria and hematuria.  Skin: Negative for rash.  Neurological: Negative for weakness and headaches.  Psychiatric/Behavioral: Negative for sleep disturbance. The patient is nervous/anxious.       Current Outpatient Medications on File Prior to Visit  Medication Sig  Dispense Refill  . acetaminophen (EXTRA STRENGTH PAIN RELIEF) 500 MG tablet Take 500 mg by mouth 3 (three) times daily with meals.     . B Complex-C (SUPER B COMPLEX PO) Take 1 tablet by mouth daily.    . Biotin (BIOTIN 5000) 5 MG CAPS Take 1 capsule by mouth 2 (two) times daily with breakfast and lunch.     . Calcium Carbonate-Vitamin D (CALCIUM + D) 600-200 MG-UNIT TABS Take 1 tablet by mouth 2 (two) times daily with breakfast and lunch.     . cholecalciferol (VITAMIN D) 1000 units tablet Take 1,000 Units by mouth 2 (two) times daily.    . Coenzyme Q10 (COQ10 PO) Take 1 tablet by mouth daily with breakfast.     . CRANBERRY PO Take 4,200 mg by mouth 2 (two) times daily.     . ferrous sulfate 325 (65 FE) MG tablet Take 325 mg by mouth.     Marland Kitchen glucosamine-chondroitin 500-400 MG tablet Take 1 tablet by mouth 2 (two) times daily at 8 am and 10 pm.     . Guaifenesin 1200 MG TB12 Take 600 mg by mouth 2 (two) times daily.    . Lactobacillus (ACIDOPHILUS) CAPS capsule Take 1 capsule by mouth 3 (three) times daily.     Marland Kitchen MAGNESIUM PO Take 250 mg by mouth. Take 1 tablet by mouth daily.     . Misc Natural Products (TART CHERRY ADVANCED PO) Take by mouth. Take 1 capsule a day.    . Nutritional Supplements (JUICE PLUS FIBRE PO) Take 3 capsules by mouth 2 (two) times daily.    Marland Kitchen  Omega-3 Fatty Acids (FISH OIL) 1200 MG CAPS Take 1 capsule by mouth daily.    . TURMERIC PO Take 500 mg by mouth.      No current facility-administered medications on file prior to visit.      Past Medical History:  Diagnosis Date  . Arthritis    HX of chronic pain from Arthritis  . Cancer of skin    scalp  . DVT, lower extremity (HCC)    Allergies  Allergen Reactions  . Percocet [Oxycodone-Acetaminophen] Nausea And Vomiting  . Gabapentin Other (See Comments)    Weakness with Fatigue  . Cefuroxime Axetil   . Diflunisal     Give by dnetist had a bad reaction  . Levofloxacin     To many side effects and did not take    . Lexapro [Escitalopram Oxalate]     Patient states she had a "bad reaction"  . Nystatin     Just states she can't take this  . Penicillins     Childhood reaction  Has patient had a PCN reaction causing immediate rash, facial/tongue/throat swelling, SOB or lightheadedness with hypotension: Unknown Has patient had a PCN reaction causing severe rash involving mucus membranes or skin necrosis: Unknown Has patient had a PCN reaction that required hospitalization: Unknown Has patient had a PCN reaction occurring within the last 10 years: Unknown If all of the above answers are "NO", then may proceed with Cephalosporin use.   . Azelastine-Fluticasone Rash    Social History   Socioeconomic History  . Marital status: Widowed    Spouse name: Not on file  . Number of children: Not on file  . Years of education: Not on file  . Highest education level: Not on file  Occupational History  . Not on file  Social Needs  . Financial resource strain: Not on file  . Food insecurity:    Worry: Not on file    Inability: Not on file  . Transportation needs:    Medical: Not on file    Non-medical: Not on file  Tobacco Use  . Smoking status: Never Smoker  . Smokeless tobacco: Never Used  Substance and Sexual Activity  . Alcohol use: No  . Drug use: No  . Sexual activity: Not on file  Lifestyle  . Physical activity:    Days per week: Not on file    Minutes per session: Not on file  . Stress: Not on file  Relationships  . Social connections:    Talks on phone: Not on file    Gets together: Not on file    Attends religious service: Not on file    Active member of club or organization: Not on file    Attends meetings of clubs or organizations: Not on file    Relationship status: Not on file  Other Topics Concern  . Not on file  Social History Narrative  . Not on file    Vitals:   07/06/17 1459  BP: 122/74  Pulse: 83  Resp: 12  Temp: 97.6 F (36.4 C)  SpO2: 99%   Body mass  index is 25.63 kg/m.    Physical Exam  Nursing note and vitals reviewed. Constitutional: She is oriented to person, place, and time. She appears well-developed and well-nourished. She does not appear ill. No distress.  HENT:  Head: Normocephalic and atraumatic.  Mouth/Throat: Oropharynx is clear and moist and mucous membranes are normal.  Eyes: Conjunctivae are normal. No scleral icterus.  Cardiovascular: Normal  rate and regular rhythm.  No murmur heard. Respiratory: Effort normal and breath sounds normal. No respiratory distress.  GI: Soft. Bowel sounds are normal. She exhibits no mass. There is no hepatomegaly.  Musculoskeletal: She exhibits no edema.  Lymphadenopathy:    She has no cervical adenopathy.  Neurological: She is alert and oriented to person, place, and time. She has normal strength. Gait normal.  Skin: Skin is warm. No rash noted. No erythema.  Psychiatric: She has a normal mood and affect.  Well groomed, good eye contact.    ASSESSMENT AND PLAN:  Ms.  [name] was seen today for diarrhea.  Diagnoses and all orders for this visit:  Diarrhea, unspecified type  Persistent. We reviewed possible causes.  Sine she is not sure about nausea,recomend discontinuing Omeprazole. She has Hx of IBS-D but had no symptoms for years. Colonoscopy in 2015. Continue adequate hydration. Imodium OTC once daily as needed. GI referral placed.  -     Ambulatory referral to Gastroenterology  Gastroesophageal reflux disease, esophagitis presence not specified  Continue GERD precautions. Stop [name] could also contribute to diarrhea.  Hypokalemia  Continue K-DUR 10 meq daily. K+ in 4 weeks.  -     potassium chloride (K-DUR,KLOR-CON) 10 MEQ tablet; Take 1 tablet (10 mEq total) by mouth daily. -     Potassium; Future      [name] G. Martinique, MD  [facility]. [place] office.

## 2017-07-06 NOTE — Patient Instructions (Addendum)
A few things to remember from today's visit:   Gastroesophageal reflux disease, esophagitis presence not specified  Hypokalemia - Plan: potassium chloride (K-DUR,KLOR-CON) 10 MEQ tablet  Diarrhea, unspecified type - Plan: Ambulatory referral to Gastroenterology  Discontinue Prilosec. Avoid food that can irritate stomach.   Please be sure medication list is accurate. If a new problem present, please set up appointment sooner than planned today.

## 2017-07-07 ENCOUNTER — Encounter: Payer: Self-pay | Admitting: Family Medicine

## 2017-07-08 ENCOUNTER — Encounter: Payer: Self-pay | Admitting: Family Medicine

## 2017-07-20 DIAGNOSIS — R1084 Generalized abdominal pain: Secondary | ICD-10-CM | POA: Diagnosis not present

## 2017-07-20 DIAGNOSIS — R197 Diarrhea, unspecified: Secondary | ICD-10-CM | POA: Diagnosis not present

## 2017-07-20 DIAGNOSIS — R634 Abnormal weight loss: Secondary | ICD-10-CM | POA: Diagnosis not present

## 2017-07-20 DIAGNOSIS — E876 Hypokalemia: Secondary | ICD-10-CM | POA: Diagnosis not present

## 2017-07-26 ENCOUNTER — Other Ambulatory Visit: Payer: Medicare HMO

## 2017-07-26 DIAGNOSIS — R634 Abnormal weight loss: Secondary | ICD-10-CM | POA: Diagnosis not present

## 2017-07-26 DIAGNOSIS — K529 Noninfective gastroenteritis and colitis, unspecified: Secondary | ICD-10-CM | POA: Diagnosis not present

## 2017-07-26 DIAGNOSIS — R197 Diarrhea, unspecified: Secondary | ICD-10-CM | POA: Diagnosis not present

## 2017-07-26 DIAGNOSIS — K52832 Lymphocytic colitis: Secondary | ICD-10-CM | POA: Diagnosis not present

## 2017-07-26 DIAGNOSIS — K6389 Other specified diseases of intestine: Secondary | ICD-10-CM | POA: Diagnosis not present

## 2017-07-26 LAB — HM COLONOSCOPY

## 2017-08-03 ENCOUNTER — Other Ambulatory Visit (INDEPENDENT_AMBULATORY_CARE_PROVIDER_SITE_OTHER): Payer: Medicare HMO

## 2017-08-03 ENCOUNTER — Telehealth: Payer: Self-pay | Admitting: Family Medicine

## 2017-08-03 DIAGNOSIS — E876 Hypokalemia: Secondary | ICD-10-CM

## 2017-08-03 LAB — POTASSIUM: Potassium: 4.4 mEq/L (ref 3.5–5.1)

## 2017-08-03 NOTE — Telephone Encounter (Signed)
The patient is here today to have labs done for her potassium.   If she needs to continue potassium chloride (K-DUR,KLOR-CON) 10 MEQ tablet [[id number]]  she needs a refill sent to:  [place], [place] - [address] [phone] (Phone) [phone] (Fax)

## 2017-08-03 NOTE — Telephone Encounter (Signed)
Message sent to Dr. Jordan for review and approval. 

## 2017-08-04 ENCOUNTER — Encounter: Payer: Self-pay | Admitting: Family Medicine

## 2017-08-05 ENCOUNTER — Other Ambulatory Visit: Payer: Self-pay | Admitting: Family Medicine

## 2017-08-05 ENCOUNTER — Encounter: Payer: Self-pay | Admitting: Family Medicine

## 2017-08-05 DIAGNOSIS — E876 Hypokalemia: Secondary | ICD-10-CM

## 2017-08-05 MED ORDER — POTASSIUM CHLORIDE CRYS ER 10 MEQ PO TBCR: 10.0000 meq | EXTENDED_RELEASE_TABLET | Freq: Every day | ORAL | 0 refills | Status: AC

## 2017-08-05 NOTE — Telephone Encounter (Signed)
Rx for K-DUR was sent to her pharmacy.  Betty Martinique, MD

## 2017-08-08 ENCOUNTER — Telehealth: Payer: Self-pay | Admitting: Family Medicine

## 2017-08-08 ENCOUNTER — Encounter: Payer: Self-pay | Admitting: Family Medicine

## 2017-08-08 NOTE — Telephone Encounter (Unsigned)
Copied from [place] [phone]. Topic: Quick Communication - See Telephone Encounter >> Aug 08, 2017 12:52 PM Hewitt Shorts wrote: Pt is needing to talk with Dr. Martinique regarding the rx of potassium-she was told that if she was still having diarrhea that she should take the potassium but since her colonoscopy she has not had any diarrhea so she need to know it she still needs the potassium   Best number [phone]

## 2017-08-09 ENCOUNTER — Encounter: Payer: Self-pay | Admitting: Family Medicine

## 2017-08-09 NOTE — Telephone Encounter (Signed)
I sent a message to patient  and recommend stopping potassium supplementation given the fact she is no longer having diarrhea. Thanks, BJ

## 2017-08-09 NOTE — Telephone Encounter (Signed)
Please advise Dr Martinique if okay to stop the Potassium at this time. Thanks.

## 2017-08-11 ENCOUNTER — Encounter: Payer: Self-pay | Admitting: Family Medicine

## 2017-08-24 DIAGNOSIS — K52832 Lymphocytic colitis: Secondary | ICD-10-CM | POA: Diagnosis not present

## 2017-08-26 ENCOUNTER — Encounter: Payer: Self-pay | Admitting: Family Medicine

## 2017-10-19 DIAGNOSIS — R69 Illness, unspecified: Secondary | ICD-10-CM | POA: Diagnosis not present

## 2017-10-20 ENCOUNTER — Telehealth: Payer: Self-pay | Admitting: Family Medicine

## 2017-10-20 NOTE — Telephone Encounter (Signed)
Copied from [place] [phone]. Topic: Inquiry >> Oct 20, 2017  2:29 PM [name] wrote: Reason for CRM: pt called b/c the nurse in her facility that she lives will send a fax over for orders for assistance w/in the facility from [facility]; contact pt if needed

## 2017-10-21 NOTE — Telephone Encounter (Signed)
Fax placed on doctors desk for completion. PCP is out of the office until Tuesday, 10/25/17.

## 2017-10-25 ENCOUNTER — Other Ambulatory Visit: Payer: Self-pay | Admitting: *Deleted

## 2017-10-25 ENCOUNTER — Telehealth: Payer: Self-pay | Admitting: Family Medicine

## 2017-10-25 NOTE — Telephone Encounter (Signed)
Spoke with Nicki Reaper, gave verbal orders as requested.

## 2017-10-25 NOTE — Telephone Encounter (Signed)
Message sent to Dr. Jordan for review and approval. 

## 2017-10-25 NOTE — Telephone Encounter (Signed)
Copied from [place] [id number]. Topic: Inquiry >> Oct 25, 2017  9:36 AM [name] wrote: Reason for CRM: [facility] home health called to let pcp know that the pt is having GI issues; problems w/ appetite and weight; asking for verbals for home health nursing; contact [phone]

## 2017-10-26 NOTE — Telephone Encounter (Signed)
I believe she follows with GI. I do not think "GI issues" is an indication for home health services but patient can be evaluated to determine specific needs.  Thanks, BJ

## 2017-10-26 NOTE — Telephone Encounter (Signed)
Spoke with Nicki Reaper and gave him verbal orders to evaluate patient per Dr. Martinique.

## 2017-10-26 NOTE — Telephone Encounter (Signed)
Orders faxed back to [facility] with confirmation.

## 2017-10-27 DIAGNOSIS — L821 Other seborrheic keratosis: Secondary | ICD-10-CM | POA: Diagnosis not present

## 2017-10-27 DIAGNOSIS — D1801 Hemangioma of skin and subcutaneous tissue: Secondary | ICD-10-CM | POA: Diagnosis not present

## 2017-10-27 DIAGNOSIS — D225 Melanocytic nevi of trunk: Secondary | ICD-10-CM | POA: Diagnosis not present

## 2017-11-07 NOTE — Telephone Encounter (Signed)
Spoke with [place], RN from [facility] and she stated that patient is saying that all she need is a list of foods that she can and cannot eat that will help keep her Colitis down. Patient does not benefit from the services they offer at [facility].

## 2017-11-07 NOTE — Telephone Encounter (Signed)
Left detailed message for patient to follow-up with her GI doctor per Dr. Martinique.

## 2017-11-07 NOTE — Telephone Encounter (Signed)
I agree with assessment, she does not need home health services. She is following with gastroenterologist, so recommend following instructions given during her last visit. If she has further questions about food vs colitis, recommend scheduling follow-up appointment with her GI.  Thanks, BJ

## 2017-11-07 NOTE — Telephone Encounter (Signed)
Spoke with patient and gave instructions per Dr. Jordan. Patient verbalized understanding. 

## 2017-11-07 NOTE — Telephone Encounter (Addendum)
Holly rn [facility] has been unable to evaluate the patient. Pt is requesting nutrition info concerning her colitis only. Earnest Bailey can be reached at [phone]

## 2017-11-09 DIAGNOSIS — K219 Gastro-esophageal reflux disease without esophagitis: Secondary | ICD-10-CM | POA: Diagnosis not present

## 2017-11-09 DIAGNOSIS — K52832 Lymphocytic colitis: Secondary | ICD-10-CM | POA: Diagnosis not present

## 2017-11-26 DIAGNOSIS — R69 Illness, unspecified: Secondary | ICD-10-CM | POA: Diagnosis not present

## 2018-02-08 DIAGNOSIS — R69 Illness, unspecified: Secondary | ICD-10-CM | POA: Diagnosis not present

## 2018-05-23 ENCOUNTER — Telehealth: Payer: Self-pay | Admitting: Family Medicine

## 2018-05-23 NOTE — Telephone Encounter (Signed)
Copied from [place] [phone]. Topic: Appointment Scheduling - Transfer of Care >> May 11, 2018  4:51 PM Gustavus Messing wrote: Pt is requesting to transfer FROM: Dr. Betty Martinique Pt is requesting to transfer TO: Dr. Fransico Him Tisovec Reason for requested transfer: Medical Records to [facility]  Fax to [phone]  Send CRM to patient's current PCP (transferring FROM). >> May 15, 2018 11:58 AM Cox, Melburn Hake, CMA wrote: [facility]  Pt will need to complete records release form in order for these to be sent out. We are asking patients to not come in to the office at this time due to potential exposure. This request can be completed via My Chart.    >> May 19, 2018  4:03 PM Reyne Dumas L wrote: Pt returned call and left message on [place].  States that she can not access MyChart at this time and would like form mailed to her at [address] [place], [place] [address]. Pt states she can be reached at [phone]

## 2018-06-09 ENCOUNTER — Encounter: Payer: Self-pay | Admitting: Family Medicine

## 2018-06-10 DIAGNOSIS — R52 Pain, unspecified: Secondary | ICD-10-CM | POA: Diagnosis not present

## 2018-06-10 DIAGNOSIS — R079 Chest pain, unspecified: Secondary | ICD-10-CM | POA: Diagnosis not present

## 2018-06-10 DIAGNOSIS — R69 Illness, unspecified: Secondary | ICD-10-CM | POA: Diagnosis not present

## 2018-06-11 ENCOUNTER — Encounter: Payer: Self-pay | Admitting: Family Medicine

## 2018-06-14 DIAGNOSIS — Z1331 Encounter for screening for depression: Secondary | ICD-10-CM | POA: Diagnosis not present

## 2018-06-14 DIAGNOSIS — K52832 Lymphocytic colitis: Secondary | ICD-10-CM | POA: Diagnosis not present

## 2018-06-14 DIAGNOSIS — K589 Irritable bowel syndrome without diarrhea: Secondary | ICD-10-CM | POA: Diagnosis not present

## 2018-06-14 DIAGNOSIS — Z86711 Personal history of pulmonary embolism: Secondary | ICD-10-CM | POA: Diagnosis not present

## 2018-06-14 DIAGNOSIS — R69 Illness, unspecified: Secondary | ICD-10-CM | POA: Diagnosis not present

## 2018-06-16 DIAGNOSIS — F5082 Avoidant/restrictive food intake disorder: Secondary | ICD-10-CM | POA: Diagnosis not present

## 2018-06-16 DIAGNOSIS — F419 Anxiety disorder, unspecified: Secondary | ICD-10-CM | POA: Diagnosis not present

## 2018-06-16 DIAGNOSIS — R69 Illness, unspecified: Secondary | ICD-10-CM | POA: Diagnosis not present

## 2018-07-11 DIAGNOSIS — F419 Anxiety disorder, unspecified: Secondary | ICD-10-CM | POA: Diagnosis not present

## 2018-07-11 DIAGNOSIS — R69 Illness, unspecified: Secondary | ICD-10-CM | POA: Diagnosis not present

## 2018-07-11 DIAGNOSIS — F4310 Post-traumatic stress disorder, unspecified: Secondary | ICD-10-CM | POA: Diagnosis not present

## 2018-08-03 DIAGNOSIS — F419 Anxiety disorder, unspecified: Secondary | ICD-10-CM | POA: Diagnosis not present

## 2018-08-03 DIAGNOSIS — F5082 Avoidant/restrictive food intake disorder: Secondary | ICD-10-CM | POA: Diagnosis not present

## 2018-08-03 DIAGNOSIS — R69 Illness, unspecified: Secondary | ICD-10-CM | POA: Diagnosis not present

## 2018-09-29 DIAGNOSIS — F5082 Avoidant/restrictive food intake disorder: Secondary | ICD-10-CM | POA: Diagnosis not present

## 2018-09-29 DIAGNOSIS — R69 Illness, unspecified: Secondary | ICD-10-CM | POA: Diagnosis not present

## 2018-11-01 DIAGNOSIS — R69 Illness, unspecified: Secondary | ICD-10-CM | POA: Diagnosis not present

## 2018-11-01 DIAGNOSIS — F5082 Avoidant/restrictive food intake disorder: Secondary | ICD-10-CM | POA: Diagnosis not present

## 2018-11-09 DIAGNOSIS — R69 Illness, unspecified: Secondary | ICD-10-CM | POA: Diagnosis not present

## 2018-11-13 DIAGNOSIS — R69 Illness, unspecified: Secondary | ICD-10-CM | POA: Diagnosis not present

## 2018-11-29 DIAGNOSIS — R69 Illness, unspecified: Secondary | ICD-10-CM | POA: Diagnosis not present

## 2018-11-29 DIAGNOSIS — F419 Anxiety disorder, unspecified: Secondary | ICD-10-CM | POA: Diagnosis not present

## 2018-12-28 DIAGNOSIS — R69 Illness, unspecified: Secondary | ICD-10-CM | POA: Diagnosis not present

## 2018-12-28 DIAGNOSIS — F5082 Avoidant/restrictive food intake disorder: Secondary | ICD-10-CM | POA: Diagnosis not present

## 2019-01-26 DIAGNOSIS — R69 Illness, unspecified: Secondary | ICD-10-CM | POA: Diagnosis not present

## 2019-03-12 DIAGNOSIS — F5082 Avoidant/restrictive food intake disorder: Secondary | ICD-10-CM | POA: Diagnosis not present

## 2019-03-12 DIAGNOSIS — R69 Illness, unspecified: Secondary | ICD-10-CM | POA: Diagnosis not present

## 2019-03-22 DIAGNOSIS — R69 Illness, unspecified: Secondary | ICD-10-CM | POA: Diagnosis not present

## 2019-04-11 DIAGNOSIS — R69 Illness, unspecified: Secondary | ICD-10-CM | POA: Diagnosis not present

## 2019-04-11 DIAGNOSIS — F419 Anxiety disorder, unspecified: Secondary | ICD-10-CM | POA: Diagnosis not present

## 2019-05-03 DIAGNOSIS — R69 Illness, unspecified: Secondary | ICD-10-CM | POA: Diagnosis not present

## 2019-05-03 DIAGNOSIS — F5082 Avoidant/restrictive food intake disorder: Secondary | ICD-10-CM | POA: Diagnosis not present

## 2019-06-04 DIAGNOSIS — R69 Illness, unspecified: Secondary | ICD-10-CM | POA: Diagnosis not present

## 2019-07-06 DIAGNOSIS — R69 Illness, unspecified: Secondary | ICD-10-CM | POA: Diagnosis not present

## 2019-07-11 DIAGNOSIS — R7989 Other specified abnormal findings of blood chemistry: Secondary | ICD-10-CM | POA: Diagnosis not present

## 2019-07-11 DIAGNOSIS — Z0000 Encounter for general adult medical examination without abnormal findings: Secondary | ICD-10-CM | POA: Diagnosis not present

## 2019-07-18 DIAGNOSIS — R82998 Other abnormal findings in urine: Secondary | ICD-10-CM | POA: Diagnosis not present

## 2019-07-18 DIAGNOSIS — Z1331 Encounter for screening for depression: Secondary | ICD-10-CM | POA: Diagnosis not present

## 2019-07-18 DIAGNOSIS — R69 Illness, unspecified: Secondary | ICD-10-CM | POA: Diagnosis not present

## 2019-07-18 DIAGNOSIS — Z1339 Encounter for screening examination for other mental health and behavioral disorders: Secondary | ICD-10-CM | POA: Diagnosis not present

## 2019-07-18 DIAGNOSIS — Z86711 Personal history of pulmonary embolism: Secondary | ICD-10-CM | POA: Diagnosis not present

## 2019-07-18 DIAGNOSIS — K589 Irritable bowel syndrome without diarrhea: Secondary | ICD-10-CM | POA: Diagnosis not present

## 2019-07-18 DIAGNOSIS — L719 Rosacea, unspecified: Secondary | ICD-10-CM | POA: Diagnosis not present

## 2019-07-18 DIAGNOSIS — Z0000 Encounter for general adult medical examination without abnormal findings: Secondary | ICD-10-CM | POA: Diagnosis not present

## 2019-07-18 DIAGNOSIS — K52832 Lymphocytic colitis: Secondary | ICD-10-CM | POA: Diagnosis not present

## 2019-07-26 DIAGNOSIS — Z1212 Encounter for screening for malignant neoplasm of rectum: Secondary | ICD-10-CM | POA: Diagnosis not present

## 2019-08-01 DIAGNOSIS — R69 Illness, unspecified: Secondary | ICD-10-CM | POA: Diagnosis not present

## 2019-11-30 DIAGNOSIS — R69 Illness, unspecified: Secondary | ICD-10-CM | POA: Diagnosis not present

## 2019-12-25 DIAGNOSIS — R69 Illness, unspecified: Secondary | ICD-10-CM | POA: Diagnosis not present

## 2020-01-14 DIAGNOSIS — K589 Irritable bowel syndrome without diarrhea: Secondary | ICD-10-CM | POA: Diagnosis not present

## 2020-01-14 DIAGNOSIS — L719 Rosacea, unspecified: Secondary | ICD-10-CM | POA: Diagnosis not present

## 2020-01-14 DIAGNOSIS — Z86711 Personal history of pulmonary embolism: Secondary | ICD-10-CM | POA: Diagnosis not present

## 2020-01-14 DIAGNOSIS — R69 Illness, unspecified: Secondary | ICD-10-CM | POA: Diagnosis not present

## 2020-01-14 DIAGNOSIS — K52832 Lymphocytic colitis: Secondary | ICD-10-CM | POA: Diagnosis not present

## 2020-03-21 DIAGNOSIS — R197 Diarrhea, unspecified: Secondary | ICD-10-CM | POA: Diagnosis not present

## 2020-03-21 DIAGNOSIS — Z1152 Encounter for screening for COVID-19: Secondary | ICD-10-CM | POA: Diagnosis not present

## 2020-03-21 DIAGNOSIS — U071 COVID-19: Secondary | ICD-10-CM | POA: Diagnosis not present

## 2020-03-21 DIAGNOSIS — R5383 Other fatigue: Secondary | ICD-10-CM | POA: Diagnosis not present

## 2020-07-23 DIAGNOSIS — Z0000 Encounter for general adult medical examination without abnormal findings: Secondary | ICD-10-CM | POA: Diagnosis not present

## 2020-07-25 DIAGNOSIS — Z0000 Encounter for general adult medical examination without abnormal findings: Secondary | ICD-10-CM | POA: Diagnosis not present

## 2020-07-25 DIAGNOSIS — R82998 Other abnormal findings in urine: Secondary | ICD-10-CM | POA: Diagnosis not present

## 2020-07-25 DIAGNOSIS — R69 Illness, unspecified: Secondary | ICD-10-CM | POA: Diagnosis not present

## 2020-07-25 DIAGNOSIS — F5082 Avoidant/restrictive food intake disorder: Secondary | ICD-10-CM | POA: Diagnosis not present

## 2020-07-25 DIAGNOSIS — K52832 Lymphocytic colitis: Secondary | ICD-10-CM | POA: Diagnosis not present

## 2020-07-25 DIAGNOSIS — Z1331 Encounter for screening for depression: Secondary | ICD-10-CM | POA: Diagnosis not present

## 2020-07-25 DIAGNOSIS — L719 Rosacea, unspecified: Secondary | ICD-10-CM | POA: Diagnosis not present

## 2020-07-25 DIAGNOSIS — Z1339 Encounter for screening examination for other mental health and behavioral disorders: Secondary | ICD-10-CM | POA: Diagnosis not present

## 2020-07-25 DIAGNOSIS — Z8616 Personal history of COVID-19: Secondary | ICD-10-CM | POA: Diagnosis not present

## 2020-07-25 DIAGNOSIS — Z86711 Personal history of pulmonary embolism: Secondary | ICD-10-CM | POA: Diagnosis not present

## 2020-07-25 DIAGNOSIS — F4310 Post-traumatic stress disorder, unspecified: Secondary | ICD-10-CM | POA: Diagnosis not present

## 2020-07-25 DIAGNOSIS — K589 Irritable bowel syndrome without diarrhea: Secondary | ICD-10-CM | POA: Diagnosis not present

## 2020-07-25 DIAGNOSIS — F419 Anxiety disorder, unspecified: Secondary | ICD-10-CM | POA: Diagnosis not present

## 2020-07-28 DIAGNOSIS — Z1212 Encounter for screening for malignant neoplasm of rectum: Secondary | ICD-10-CM | POA: Diagnosis not present

## 2020-09-09 DIAGNOSIS — K52832 Lymphocytic colitis: Secondary | ICD-10-CM | POA: Diagnosis not present

## 2020-09-09 DIAGNOSIS — R69 Illness, unspecified: Secondary | ICD-10-CM | POA: Diagnosis not present

## 2020-09-09 DIAGNOSIS — K589 Irritable bowel syndrome without diarrhea: Secondary | ICD-10-CM | POA: Diagnosis not present

## 2021-01-27 DIAGNOSIS — R69 Illness, unspecified: Secondary | ICD-10-CM | POA: Diagnosis not present

## 2021-01-27 DIAGNOSIS — Z8616 Personal history of COVID-19: Secondary | ICD-10-CM | POA: Diagnosis not present

## 2021-01-27 DIAGNOSIS — K52832 Lymphocytic colitis: Secondary | ICD-10-CM | POA: Diagnosis not present

## 2021-01-27 DIAGNOSIS — L719 Rosacea, unspecified: Secondary | ICD-10-CM | POA: Diagnosis not present

## 2021-01-27 DIAGNOSIS — F419 Anxiety disorder, unspecified: Secondary | ICD-10-CM | POA: Diagnosis not present

## 2021-01-27 DIAGNOSIS — F4310 Post-traumatic stress disorder, unspecified: Secondary | ICD-10-CM | POA: Diagnosis not present

## 2021-01-27 DIAGNOSIS — Z86711 Personal history of pulmonary embolism: Secondary | ICD-10-CM | POA: Diagnosis not present

## 2021-01-27 DIAGNOSIS — F5082 Avoidant/restrictive food intake disorder: Secondary | ICD-10-CM | POA: Diagnosis not present

## 2021-01-27 DIAGNOSIS — K589 Irritable bowel syndrome without diarrhea: Secondary | ICD-10-CM | POA: Diagnosis not present

## 2021-03-16 DIAGNOSIS — R509 Fever, unspecified: Secondary | ICD-10-CM | POA: Diagnosis not present

## 2021-03-16 DIAGNOSIS — R0981 Nasal congestion: Secondary | ICD-10-CM | POA: Diagnosis not present

## 2021-03-16 DIAGNOSIS — R69 Illness, unspecified: Secondary | ICD-10-CM | POA: Diagnosis not present

## 2021-03-16 DIAGNOSIS — R059 Cough, unspecified: Secondary | ICD-10-CM | POA: Diagnosis not present

## 2021-03-16 DIAGNOSIS — K52832 Lymphocytic colitis: Secondary | ICD-10-CM | POA: Diagnosis not present

## 2021-03-16 DIAGNOSIS — U071 COVID-19: Secondary | ICD-10-CM | POA: Diagnosis not present

## 2021-03-16 DIAGNOSIS — R519 Headache, unspecified: Secondary | ICD-10-CM | POA: Diagnosis not present

## 2021-03-16 DIAGNOSIS — Z1152 Encounter for screening for COVID-19: Secondary | ICD-10-CM | POA: Diagnosis not present

## 2021-07-31 DIAGNOSIS — R7989 Other specified abnormal findings of blood chemistry: Secondary | ICD-10-CM | POA: Diagnosis not present

## 2021-08-01 DIAGNOSIS — Z0000 Encounter for general adult medical examination without abnormal findings: Secondary | ICD-10-CM | POA: Diagnosis not present

## 2021-08-07 DIAGNOSIS — K589 Irritable bowel syndrome without diarrhea: Secondary | ICD-10-CM | POA: Diagnosis not present

## 2021-08-07 DIAGNOSIS — Z86711 Personal history of pulmonary embolism: Secondary | ICD-10-CM | POA: Diagnosis not present

## 2021-08-07 DIAGNOSIS — E7800 Pure hypercholesterolemia, unspecified: Secondary | ICD-10-CM | POA: Diagnosis not present

## 2021-08-07 DIAGNOSIS — F5082 Avoidant/restrictive food intake disorder: Secondary | ICD-10-CM | POA: Diagnosis not present

## 2021-08-07 DIAGNOSIS — R82998 Other abnormal findings in urine: Secondary | ICD-10-CM | POA: Diagnosis not present

## 2021-08-07 DIAGNOSIS — F419 Anxiety disorder, unspecified: Secondary | ICD-10-CM | POA: Diagnosis not present

## 2021-08-07 DIAGNOSIS — Z1389 Encounter for screening for other disorder: Secondary | ICD-10-CM | POA: Diagnosis not present

## 2021-08-07 DIAGNOSIS — L719 Rosacea, unspecified: Secondary | ICD-10-CM | POA: Diagnosis not present

## 2021-08-07 DIAGNOSIS — K52832 Lymphocytic colitis: Secondary | ICD-10-CM | POA: Diagnosis not present

## 2021-08-07 DIAGNOSIS — Z1331 Encounter for screening for depression: Secondary | ICD-10-CM | POA: Diagnosis not present

## 2021-08-07 DIAGNOSIS — Z23 Encounter for immunization: Secondary | ICD-10-CM | POA: Diagnosis not present

## 2021-08-07 DIAGNOSIS — R69 Illness, unspecified: Secondary | ICD-10-CM | POA: Diagnosis not present

## 2021-08-07 DIAGNOSIS — F4310 Post-traumatic stress disorder, unspecified: Secondary | ICD-10-CM | POA: Diagnosis not present

## 2021-08-07 DIAGNOSIS — Z0000 Encounter for general adult medical examination without abnormal findings: Secondary | ICD-10-CM | POA: Diagnosis not present

## 2022-03-03 DIAGNOSIS — K589 Irritable bowel syndrome without diarrhea: Secondary | ICD-10-CM | POA: Diagnosis not present

## 2022-03-03 DIAGNOSIS — F5082 Avoidant/restrictive food intake disorder: Secondary | ICD-10-CM | POA: Diagnosis not present

## 2022-03-03 DIAGNOSIS — K52832 Lymphocytic colitis: Secondary | ICD-10-CM | POA: Diagnosis not present

## 2022-03-03 DIAGNOSIS — F4310 Post-traumatic stress disorder, unspecified: Secondary | ICD-10-CM | POA: Diagnosis not present

## 2022-03-03 DIAGNOSIS — E7800 Pure hypercholesterolemia, unspecified: Secondary | ICD-10-CM | POA: Diagnosis not present

## 2022-03-03 DIAGNOSIS — Z86711 Personal history of pulmonary embolism: Secondary | ICD-10-CM | POA: Diagnosis not present

## 2022-03-03 DIAGNOSIS — L719 Rosacea, unspecified: Secondary | ICD-10-CM | POA: Diagnosis not present

## 2022-03-03 DIAGNOSIS — R69 Illness, unspecified: Secondary | ICD-10-CM | POA: Diagnosis not present

## 2022-03-03 DIAGNOSIS — Z8616 Personal history of COVID-19: Secondary | ICD-10-CM | POA: Diagnosis not present

## 2022-03-03 DIAGNOSIS — F419 Anxiety disorder, unspecified: Secondary | ICD-10-CM | POA: Diagnosis not present

## 2022-06-28 ENCOUNTER — Emergency Department (HOSPITAL_BASED_OUTPATIENT_CLINIC_OR_DEPARTMENT_OTHER)
Admission: EM | Admit: 2022-06-28 | Discharge: 2022-06-29 | Disposition: A | Discharge status: Home / Self Care | Payer: Medicare HMO | Attending: Emergency Medicine | Admitting: Emergency Medicine

## 2022-06-28 ENCOUNTER — Encounter (HOSPITAL_BASED_OUTPATIENT_CLINIC_OR_DEPARTMENT_OTHER): Payer: Self-pay | Admitting: Emergency Medicine

## 2022-06-28 ENCOUNTER — Other Ambulatory Visit: Payer: Self-pay

## 2022-06-28 DIAGNOSIS — Z743 Need for continuous supervision: Secondary | ICD-10-CM | POA: Diagnosis not present

## 2022-06-28 DIAGNOSIS — R300 Dysuria: Secondary | ICD-10-CM | POA: Diagnosis not present

## 2022-06-28 DIAGNOSIS — R450 Nervousness: Secondary | ICD-10-CM | POA: Diagnosis not present

## 2022-06-28 DIAGNOSIS — R319 Hematuria, unspecified: Secondary | ICD-10-CM | POA: Diagnosis not present

## 2022-06-28 DIAGNOSIS — N390 Urinary tract infection, site not specified: Secondary | ICD-10-CM

## 2022-06-28 LAB — CBC
HCT: 36.8 % (ref 36.0–46.0)
Hemoglobin: 12.3 g/dL (ref 12.0–15.0)
MCH: 31.6 pg (ref 26.0–34.0)
MCHC: 33.4 g/dL (ref 30.0–36.0)
MCV: 94.6 fL (ref 80.0–100.0)
Platelets: 161 10*3/uL (ref 150–400)
RBC: 3.89 MIL/uL (ref 3.87–5.11)
RDW: 12.3 % (ref 11.5–15.5)
WBC: 10.1 10*3/uL (ref 4.0–10.5)
nRBC: 0 % (ref 0.0–0.2)

## 2022-06-28 LAB — URINALYSIS, W/ REFLEX TO CULTURE (INFECTION SUSPECTED)
Bacteria, UA: NONE SEEN
Bilirubin Urine: NEGATIVE
Glucose, UA: NEGATIVE mg/dL
Ketones, ur: NEGATIVE mg/dL
Nitrite: NEGATIVE
Protein, ur: 100 mg/dL — AB
RBC / HPF: >50 RBC/hpf (ref 0–5)
Renal Epithelial: <1
Specific Gravity, Urine: 1.01 (ref 1.005–1.030)
WBC, UA: >50 WBC/hpf (ref 0–5)
pH: 6 (ref 5.0–8.0)

## 2022-06-28 LAB — BASIC METABOLIC PANEL
Anion gap: 10 (ref 5–15)
BUN: 14 mg/dL (ref 8–23)
CO2: 28 mmol/L (ref 22–32)
Calcium: 9.9 mg/dL (ref 8.9–10.3)
Chloride: 103 mmol/L (ref 98–111)
Creatinine, Ser: 0.64 mg/dL (ref 0.44–1.00)
GFR, Estimated: >60 mL/min (ref >60)
Glucose, Bld: 94 mg/dL (ref 70–99)
Potassium: 3.9 mmol/L (ref 3.5–5.1)
Sodium: 141 mmol/L (ref 135–145)

## 2022-06-28 MED ORDER — PHENAZOPYRIDINE HCL 100 MG PO TABS
200.0000 mg | ORAL_TABLET | Freq: Once | ORAL | Status: AC
  Administered 2022-06-29: 200 mg via ORAL
  Filled 2022-06-28: qty 2

## 2022-06-28 MED ORDER — FOSFOMYCIN TROMETHAMINE 3 G PO PACK
3.0000 g | PACK | Freq: Once | ORAL | Status: AC
  Administered 2022-06-29: 3 g via ORAL
  Filled 2022-06-28: qty 3

## 2022-06-28 NOTE — ED Provider Notes (Signed)
Nursing notes and vitals signs, including pulse oximetry, reviewed.  Summary of this visit's results, reviewed by myself:  EKG:  EKG Interpretation  Date/Time:    Ventricular Rate:    PR Interval:    QRS Duration:   QT Interval:    QTC Calculation:   R Axis:     Text Interpretation:          Labs:  Results for orders placed or performed during the hospital encounter of 06/28/22 (from the past 24 hour(s))  Urinalysis, w/ Reflex to Culture (Infection Suspected) -Urine, Clean Catch     Status: Abnormal   Collection Time: 06/28/22 10:23 PM  Result Value Ref Range   Specimen Source URINE, CLEAN CATCH    Color, Urine YELLOW YELLOW   APPearance HAZY (A) CLEAR   Specific Gravity, Urine 1.010 1.005 - 1.030   pH 6.0 5.0 - 8.0   Glucose, UA NEGATIVE NEGATIVE mg/dL   Hgb urine dipstick LARGE (A) NEGATIVE   Bilirubin Urine NEGATIVE NEGATIVE   Ketones, ur NEGATIVE NEGATIVE mg/dL   Protein, ur 161 (A) NEGATIVE mg/dL   Nitrite NEGATIVE NEGATIVE   Leukocytes,Ua LARGE (A) NEGATIVE   RBC / HPF >50 0 - 5 RBC/hpf   WBC, UA >50 0 - 5 WBC/hpf   Bacteria, UA NONE SEEN NONE SEEN   Squamous Epithelial / HPF 0-5 0 - 5 /HPF   Renal Epithelial <1   CBC     Status: None   Collection Time: 06/28/22 10:57 PM  Result Value Ref Range   WBC 10.1 4.0 - 10.5 K/uL   RBC 3.89 3.87 - 5.11 MIL/uL   Hemoglobin 12.3 12.0 - 15.0 g/dL   HCT 09.6 04.5 - 40.9 %   MCV 94.6 80.0 - 100.0 fL   MCH 31.6 26.0 - 34.0 pg   MCHC 33.4 30.0 - 36.0 g/dL   RDW 81.1 91.4 - 78.2 %   Platelets 161 150 - 400 K/uL   nRBC 0.0 0.0 - 0.2 %  Basic metabolic panel     Status: None   Collection Time: 06/28/22 10:57 PM  Result Value Ref Range   Sodium 141 135 - 145 mmol/L   Potassium 3.9 3.5 - 5.1 mmol/L   Chloride 103 98 - 111 mmol/L   CO2 28 22 - 32 mmol/L   Glucose, Bld 94 70 - 99 mg/dL   BUN 14 8 - 23 mg/dL   Creatinine, Ser 9.56 0.44 - 1.00 mg/dL   Calcium 9.9 8.9 - 21.3 mg/dL   GFR, Estimated >08 >65 mL/min   Anion  gap 10 5 - 15   Urinalysis consistent with urinary tract infection.  We will administer dose of fosfomycin in the ED.  She has an allergy to cefuroxime (she does not recall what the adverse reaction was) so we will avoid cephalosporins.  She also does not tolerate fluoroquinolones.  Patient is to pick up a prescription for antibiotics from her physician tomorrow to complete treatment.    Paula Libra, MD 06/28/22 [phone]

## 2022-06-28 NOTE — ED Notes (Addendum)
Pt. Still unable to give urine sample at this time. Specimen cup is at bedside.

## 2022-06-28 NOTE — ED Notes (Signed)
Pt attempted to provide urine sample in triage but was unable to at this time.

## 2022-06-28 NOTE — ED Provider Notes (Signed)
[place] EMERGENCY DEPARTMENT AT [facility] Provider Note   CSN: [id number] Arrival date & time: 06/28/22  2104     History  Chief Complaint  Patient presents with   Dysuria    [name] is a 72 y.o. female.   Dysuria    Patient presents to the ER for evaluation of dysuria.  Patient states over the last 2 days she has had discomfort and burning with urination.  She spoke to her doctor and started taking cranberry pills.  Patient states her symptoms persisted.  Her doctor called in antibiotics for her that will be available tomorrow.  This evening however patient has continued had stinging and burning with urination.  She then noticed blood in her urine this evening so she came to the ED for evaluation  Home Medications Prior to Admission medications   Medication Sig Start Date End Date Taking? Authorizing Provider  acetaminophen (EXTRA STRENGTH PAIN RELIEF) 500 MG tablet Take 500 mg by mouth 3 (three) times daily with meals.     [provider]  B Complex-C (SUPER B COMPLEX PO) Take 1 tablet by mouth daily.    [provider]  Biotin (BIOTIN 5000) 5 MG CAPS Take 1 capsule by mouth 2 (two) times daily with breakfast and lunch.     [provider]  budesonide (ENTOCORT EC) 3 MG 24 hr capsule TAKE 3 CAPSULES BY MOUTH ONCE DAILY IN THE MORNING 08/02/17   [provider]  Calcium Carbonate-Vitamin D (CALCIUM + D) 600-200 MG-UNIT TABS Take 1 tablet by mouth 2 (two) times daily with breakfast and lunch.     [provider]  Coenzyme Q10 (COQ10 PO) Take 1 tablet by mouth daily with breakfast.     [provider]  CRANBERRY PO Take 4,200 mg by mouth 2 (two) times daily.     [provider]  ferrous sulfate 325 (65 FE) MG tablet Take 325 mg by mouth.     [provider]  Glucosamine-Chondroit-Vit C-Mn (GLUCOSAMINE-CHONDROITIN) TABS 1,500 mg daily. Take 2 tabs daily, 1500-1200 mg    [provider]  Lactobacillus (ACIDOPHILUS) CAPS capsule Take 1 capsule by mouth 3 (three) times daily.     [provider]  Misc Natural Products (TART CHERRY ADVANCED PO) Take by mouth. Take 1 capsule a day.    [provider]  NON FORMULARY Tumeric Cucumin wGinger 1000mg  @@day     [provider]  Nutritional Supplements (JUICE PLUS FIBRE PO) Take 3 capsules by mouth 2 (two) times daily.    [provider]  Omega-3 Fatty Acids (OMEGA-3 FISH OIL) 1200 MG CAPS daily. Take 2 caps daily    [provider]  potassium chloride (K-DUR,KLOR-CON) 10 MEQ tablet Take 1 tablet (10 mEq total) by mouth daily. 08/05/17   [place], Betty G, MD  Pyridoxine HCl (B-6) 100 MG TABS 100 mg daily. Take 2 tabs daily    [provider]  Vitamin D3 (VITAMIN D) 25 MCG tablet Take 2 tabs daily    [provider]      Allergies    Percocet [oxycodone-acetaminophen], Gabapentin, Cefuroxime axetil, Diflunisal, Levofloxacin, Lexapro [escitalopram oxalate], Nystatin, Penicillins, and Azelastine-fluticasone    Review of Systems   Review of Systems  Genitourinary:  Positive for dysuria.    Physical Exam Updated Vital Signs BP (!) 147/60 (BP Location: Right Arm)   Pulse 76   Temp 98 F (36.7 C) (Oral)   Resp 16   SpO2 98%  Physical Exam Vitals and nursing note reviewed.  Constitutional:      General: She is not in acute distress.    Appearance: She is well-developed.  HENT:     Head: Normocephalic and atraumatic.     Right Ear: External ear normal.     Left Ear: External ear normal.  Eyes:     General: No scleral icterus.       Right eye: No discharge.        Left eye: No discharge.     Conjunctiva/sclera: Conjunctivae normal.  Neck:     Trachea: No tracheal deviation.  Cardiovascular:     Rate and Rhythm: Normal rate.  Pulmonary:     Effort: Pulmonary effort is normal. No respiratory distress.     Breath sounds: No stridor.  Abdominal:      General: Abdomen is flat. There is no distension.     Tenderness: There is no abdominal tenderness.  Musculoskeletal:        General: No swelling or deformity.     Cervical back: Neck supple.  Skin:    General: Skin is warm and dry.     Findings: No rash.  Neurological:     Mental Status: She is alert. Mental status is at baseline.     Cranial Nerves: No dysarthria or facial asymmetry.     Motor: No seizure activity.     ED Results / Procedures / Treatments   Labs (all labs ordered are listed, but only abnormal results are displayed) Labs Reviewed  CBC  URINALYSIS, W/ REFLEX TO CULTURE (INFECTION SUSPECTED)  BASIC METABOLIC PANEL    EKG None  Radiology No results found.  Procedures Procedures    Medications Ordered in ED Medications - No data to display  ED Course/ Medical Decision Making/ A&P Clinical Course as of 06/28/22 2323  Mon Jun 28, 2022  2317 CBC CBC normal [JK]    Clinical Course User Index [JK] Linwood Dibbles, MD                             Medical Decision Making Amount and/or Complexity of Data Reviewed Labs: ordered. Decision-making details documented in ED Course.   Patient presented to ED with complaints of dysuria and blood in her urine this evening.  Patient is afebrile.  She is not having any flank pain.  Symptoms suggestive of a urinary tract infection.  Doubt ureteral colic.  CBC does not show any anemia.  Metabolic panel ordered to assess for renal function.  Patient urinated before she was able to give a urine specimen.  Will have her drink oral fluids and check urinalysis.  Care turned over to Dr. Read Drivers at shift change pending her urinalysis and metabolic panel        Final Clinical Impression(s) / ED Diagnoses Final diagnoses:  None    Rx / DC Orders ED Discharge Orders     None         Linwood Dibbles, MD 06/28/22 2323

## 2022-06-28 NOTE — ED Notes (Signed)
Pt. Given fluids. 

## 2022-06-28 NOTE — ED Triage Notes (Signed)
Pain urination x 1 week . Notice blood in urine today Brought by EMS from [facility]

## 2022-06-30 LAB — URINE CULTURE

## 2022-07-05 DIAGNOSIS — N390 Urinary tract infection, site not specified: Secondary | ICD-10-CM | POA: Diagnosis not present

## 2022-07-06 ENCOUNTER — Telehealth: Payer: Self-pay

## 2022-07-06 NOTE — Telephone Encounter (Signed)
Transition Care Management Unsuccessful Follow-up Telephone Call  Date of discharge and from where:  06/29/2022 [facility]  Attempts:  1st Attempt  Reason for unsuccessful TCM follow-up call:  Left voice message  [name] [place]  [facility] Population Health Community Resource Care Guide   [email or web address]  ?? [phone]   Website: [email or web address]      

## 2022-07-08 ENCOUNTER — Telehealth: Payer: Self-pay

## 2022-07-08 NOTE — Telephone Encounter (Signed)
Transition Care Management Unsuccessful Follow-up Telephone Call  Date of discharge and from where:  06/29/2022 [facility]  Attempts:  2nd Attempt  Reason for unsuccessful TCM follow-up call:  Left voice message  Jakelyn Squyres [facility]  [facility] Population Health Community Resource Care Guide   [email or web address]  ?? [phone]   Website: [email or web address]  [email or web address]

## 2022-08-06 DIAGNOSIS — R7989 Other specified abnormal findings of blood chemistry: Secondary | ICD-10-CM | POA: Diagnosis not present

## 2022-08-06 DIAGNOSIS — F419 Anxiety disorder, unspecified: Secondary | ICD-10-CM | POA: Diagnosis not present

## 2022-08-06 DIAGNOSIS — E7800 Pure hypercholesterolemia, unspecified: Secondary | ICD-10-CM | POA: Diagnosis not present

## 2022-08-13 DIAGNOSIS — K589 Irritable bowel syndrome without diarrhea: Secondary | ICD-10-CM | POA: Diagnosis not present

## 2022-08-13 DIAGNOSIS — Z1331 Encounter for screening for depression: Secondary | ICD-10-CM | POA: Diagnosis not present

## 2022-08-13 DIAGNOSIS — F4310 Post-traumatic stress disorder, unspecified: Secondary | ICD-10-CM | POA: Diagnosis not present

## 2022-08-13 DIAGNOSIS — Z86711 Personal history of pulmonary embolism: Secondary | ICD-10-CM | POA: Diagnosis not present

## 2022-08-13 DIAGNOSIS — L719 Rosacea, unspecified: Secondary | ICD-10-CM | POA: Diagnosis not present

## 2022-08-13 DIAGNOSIS — F5082 Avoidant/restrictive food intake disorder: Secondary | ICD-10-CM | POA: Diagnosis not present

## 2022-08-13 DIAGNOSIS — E7800 Pure hypercholesterolemia, unspecified: Secondary | ICD-10-CM | POA: Diagnosis not present

## 2022-08-13 DIAGNOSIS — Z8616 Personal history of COVID-19: Secondary | ICD-10-CM | POA: Diagnosis not present

## 2022-08-13 DIAGNOSIS — Z0000 Encounter for general adult medical examination without abnormal findings: Secondary | ICD-10-CM | POA: Diagnosis not present

## 2022-08-13 DIAGNOSIS — Z1339 Encounter for screening examination for other mental health and behavioral disorders: Secondary | ICD-10-CM | POA: Diagnosis not present

## 2022-08-13 DIAGNOSIS — F419 Anxiety disorder, unspecified: Secondary | ICD-10-CM | POA: Diagnosis not present

## 2022-08-13 DIAGNOSIS — R82998 Other abnormal findings in urine: Secondary | ICD-10-CM | POA: Diagnosis not present

## 2022-08-13 DIAGNOSIS — K52832 Lymphocytic colitis: Secondary | ICD-10-CM | POA: Diagnosis not present

## 2022-08-14 DIAGNOSIS — Z1212 Encounter for screening for malignant neoplasm of rectum: Secondary | ICD-10-CM | POA: Diagnosis not present

## 2023-03-24 DIAGNOSIS — Z86711 Personal history of pulmonary embolism: Secondary | ICD-10-CM | POA: Diagnosis not present

## 2023-03-24 DIAGNOSIS — K589 Irritable bowel syndrome without diarrhea: Secondary | ICD-10-CM | POA: Diagnosis not present

## 2023-03-24 DIAGNOSIS — L719 Rosacea, unspecified: Secondary | ICD-10-CM | POA: Diagnosis not present

## 2023-03-24 DIAGNOSIS — K52832 Lymphocytic colitis: Secondary | ICD-10-CM | POA: Diagnosis not present

## 2023-03-24 DIAGNOSIS — F5082 Avoidant/restrictive food intake disorder: Secondary | ICD-10-CM | POA: Diagnosis not present

## 2023-03-24 DIAGNOSIS — F4310 Post-traumatic stress disorder, unspecified: Secondary | ICD-10-CM | POA: Diagnosis not present

## 2023-03-24 DIAGNOSIS — F419 Anxiety disorder, unspecified: Secondary | ICD-10-CM | POA: Diagnosis not present

## 2023-03-24 DIAGNOSIS — E7800 Pure hypercholesterolemia, unspecified: Secondary | ICD-10-CM | POA: Diagnosis not present

## 2023-03-29 DIAGNOSIS — R0981 Nasal congestion: Secondary | ICD-10-CM | POA: Diagnosis not present

## 2023-03-29 DIAGNOSIS — R051 Acute cough: Secondary | ICD-10-CM | POA: Diagnosis not present

## 2023-03-29 DIAGNOSIS — R509 Fever, unspecified: Secondary | ICD-10-CM | POA: Diagnosis not present

## 2023-03-29 DIAGNOSIS — Z1152 Encounter for screening for COVID-19: Secondary | ICD-10-CM | POA: Diagnosis not present

## 2023-03-29 DIAGNOSIS — J101 Influenza due to other identified influenza virus with other respiratory manifestations: Secondary | ICD-10-CM | POA: Diagnosis not present

## 2023-03-29 DIAGNOSIS — R5383 Other fatigue: Secondary | ICD-10-CM | POA: Diagnosis not present

## 2023-03-29 DIAGNOSIS — J029 Acute pharyngitis, unspecified: Secondary | ICD-10-CM | POA: Diagnosis not present

## 2023-08-15 DIAGNOSIS — E7800 Pure hypercholesterolemia, unspecified: Secondary | ICD-10-CM | POA: Diagnosis not present

## 2023-08-15 DIAGNOSIS — R7989 Other specified abnormal findings of blood chemistry: Secondary | ICD-10-CM | POA: Diagnosis not present
